# Patient Record
Sex: Male | Born: 1955
Health system: Southern US, Community
[De-identification: ages and names within clinical notes are randomized; demographics above are authoritative.]

## PROBLEM LIST (undated history)

## (undated) DIAGNOSIS — I10 Essential (primary) hypertension: Secondary | ICD-10-CM

---

## 2010-12-01 ENCOUNTER — Emergency Department (HOSPITAL_COMMUNITY)
Admission: EM | Admit: 2010-12-01 | Discharge: 2010-12-02 | Disposition: A | Discharge status: Home / Self Care | Payer: Self-pay | Attending: Emergency Medicine | Admitting: Emergency Medicine

## 2010-12-01 ENCOUNTER — Emergency Department (HOSPITAL_COMMUNITY): Payer: Self-pay

## 2010-12-01 ENCOUNTER — Encounter: Payer: Self-pay | Admitting: Emergency Medicine

## 2010-12-01 DIAGNOSIS — M549 Dorsalgia, unspecified: Secondary | ICD-10-CM

## 2010-12-01 DIAGNOSIS — Z79899 Other long term (current) drug therapy: Secondary | ICD-10-CM | POA: Insufficient documentation

## 2010-12-01 DIAGNOSIS — M545 Low back pain, unspecified: Secondary | ICD-10-CM | POA: Insufficient documentation

## 2010-12-01 DIAGNOSIS — R109 Unspecified abdominal pain: Secondary | ICD-10-CM | POA: Insufficient documentation

## 2010-12-01 DIAGNOSIS — R269 Unspecified abnormalities of gait and mobility: Secondary | ICD-10-CM | POA: Insufficient documentation

## 2010-12-01 DIAGNOSIS — I10 Essential (primary) hypertension: Secondary | ICD-10-CM | POA: Insufficient documentation

## 2010-12-01 HISTORY — DX: Essential (primary) hypertension: I10

## 2010-12-01 LAB — COMPREHENSIVE METABOLIC PANEL
Albumin: 4 g/dL (ref 3.5–5.2)
BUN: 15 mg/dL (ref 6–23)
Calcium: 9.8 mg/dL (ref 8.4–10.5)
GFR calc Af Amer: >90 mL/min (ref >90)
Glucose, Bld: 89 mg/dL (ref 70–99)
Total Protein: 7.5 g/dL (ref 6.0–8.3)

## 2010-12-01 LAB — CBC
HCT: 37.1 % — ABNORMAL LOW (ref 39.0–52.0)
Hemoglobin: 13.2 g/dL (ref 13.0–17.0)
MCV: 91.2 fL (ref 78.0–100.0)
RBC: 4.07 MIL/uL — ABNORMAL LOW (ref 4.22–5.81)
WBC: 5.4 10*3/uL (ref 4.0–10.5)

## 2010-12-01 LAB — DIFFERENTIAL
Basophils Absolute: 0 10*3/uL (ref 0.0–0.1)
Lymphocytes Relative: 41 % (ref 12–46)
Lymphs Abs: 2.2 10*3/uL (ref 0.7–4.0)
Monocytes Absolute: 0.4 10*3/uL (ref 0.1–1.0)
Monocytes Relative: 8 % (ref 3–12)
Neutro Abs: 2.6 10*3/uL (ref 1.7–7.7)

## 2010-12-01 MED ORDER — ONDANSETRON 8 MG PO TBDP
8.0000 mg | ORAL_TABLET | Freq: Once | ORAL | Status: AC
  Administered 2010-12-01: 8 mg via ORAL
  Filled 2010-12-01: qty 1

## 2010-12-01 MED ORDER — HYDROMORPHONE HCL PF 1 MG/ML IJ SOLN
1.0000 mg | Freq: Once | INTRAMUSCULAR | Status: AC
  Administered 2010-12-01: 1 mg via INTRAMUSCULAR
  Filled 2010-12-01: qty 1

## 2010-12-01 NOTE — ED Provider Notes (Signed)
History     CSN: [id number] Arrival date & time: 12/01/2010  8:04 PM   First MD Initiated Contact with Patient 12/01/10 2217      Chief Complaint  Patient presents with  . Back Pain    Onset three weeks ago    (Consider location/radiation/quality/duration/timing/severity/associated sxs/prior treatment) HPI Comments: Patient presents with a chief complaint of left lower back pain. He states the onset was couple weeks ago however it is gotten progressively worse and today it is unbearable. He denies any trauma, injury, lifting, or a history of chronic back pain. He denies numbness and tingling of his extremities and lost control of bowel and bladder. Patient also denies urinary symptoms including frequency, hematuria, urgency, and retention.  Patient is a 55 y.o. male presenting with back pain. The history is provided by the patient.  Back Pain  Pertinent negatives include no chest pain, no fever, no numbness, no headaches, no abdominal pain and no weakness.    Past Medical History  Diagnosis Date  . Hypertension     History reviewed. No pertinent past surgical history.  No family history on file.  History  Substance Use Topics  . Smoking status: Never Smoker   . Smokeless tobacco: Not on file  . Alcohol Use: No      Review of Systems  Constitutional: Positive for activity change. Negative for fever, chills, diaphoresis, fatigue and unexpected weight change.  HENT: Negative for neck pain and neck stiffness.   Eyes: Negative for visual disturbance.  Respiratory: Negative for shortness of breath.   Cardiovascular: Negative for chest pain and leg swelling.  Gastrointestinal: Negative for nausea, abdominal pain, constipation and rectal pain.  Genitourinary: Negative for urgency and difficulty urinating.       Patient denies bowel and bladder incontinence.  Musculoskeletal: Positive for back pain and gait problem. Negative for myalgias, joint swelling and arthralgias.    Neurological: Negative for weakness, numbness and headaches.  All other systems reviewed and are negative.    Allergies  Review of patient's allergies indicates no known allergies.  Home Medications   Current Outpatient Rx  Name Route Sig Dispense Refill  . AMLODIPINE BESYLATE 5 MG PO TABS Oral Take 5 mg by mouth daily.      . INDOMETHACIN 25 MG PO CAPS Oral Take 25 mg by mouth 3 (three) times daily with meals.        BP 151/89  Pulse 82  Temp(Src) 98 F (36.7 C) (Oral)  Resp 16  Wt 170 lb (77.111 kg)  SpO2 99%  Physical Exam  Constitutional: He is oriented to person, place, and time. He appears well-developed and well-nourished. No distress.  HENT:  Head: Normocephalic and atraumatic.  Eyes: Conjunctivae and EOM are normal. Pupils are equal, round, and reactive to light. No scleral icterus.  Neck: Normal range of motion and full passive range of motion without pain. Neck supple. No tracheal tenderness, no spinous process tenderness and no muscular tenderness present. Carotid bruit is not present. No Brudzinski's sign noted. No mass and no thyromegaly present.  Cardiovascular: Normal rate, regular rhythm and intact distal pulses.  Exam reveals no gallop and no friction rub.   No murmur heard. Pulmonary/Chest: Effort normal and breath sounds normal. No stridor. No respiratory distress. He has no wheezes. He has no rales. He exhibits no tenderness.  Abdominal: Soft. Bowel sounds are normal.  Genitourinary:       Severe CVA tenderness on the left side.  Musculoskeletal: Normal range of  motion.       Cervical back: He exhibits normal range of motion, no tenderness, no bony tenderness and no pain.       Thoracic back: Normal. He exhibits no tenderness, no bony tenderness and no pain.       Lumbar back: Normal. He exhibits no tenderness, no bony tenderness, no pain, no spasm and normal pulse.       Right foot: He exhibits no swelling.       Left foot: He exhibits no swelling.        Pt has increased pain w ROM of lumbar spine. Pain w ambulation.   Neurological: He is alert and oriented to person, place, and time. He has normal strength and normal reflexes. No cranial nerve deficit or sensory deficit.  Skin: Skin is warm and dry. No rash noted. He is not diaphoretic. No erythema. No pallor.  Psychiatric: He has a normal mood and affect.    ED Course  Procedures (including critical care time)  Labs Reviewed  COMPREHENSIVE METABOLIC PANEL - Abnormal; Notable for the following:    GFR calc non Af Amer 81 (*)    All other components within normal limits  CBC - Abnormal; Notable for the following:    RBC 4.07 (*)    HCT 37.1 (*)    All other components within normal limits  DIFFERENTIAL     No diagnosis found.   RADIOLOGY REPORT*  Clinical Data: 55 year old male with abdominal pain, back pain.  CT ABDOMEN AND PELVIS WITHOUT CONTRAST  Technique: Multidetector CT imaging of the abdomen and pelvis was performed following the standard protocol without intravenous contrast.  Comparison: None.  Findings: Lung bases are clear. 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 No acute osseous abnormality identified. There is borderline to mild lower lumbar degenerative spinal stenosis related to disc bulges.  No pelvic free fluid. Stool ball in the rectum. Negative distal colon otherwise. Retained stool throughout the more proximal colon. Normal appendix. No dilated small bowel. Negative noncontrast stomach, duodenum, liver, gallbladder, spleen, pancreas, and adrenal glands.  No left nephrolithiasis, hydronephrosis, hydroureter, or perinephric stranding. Multiple pelvic phleboliths. No right hydronephrosis, nephrolithiasis, hydroureter, or perinephric stranding. No ureteral calculus identified. No abdominal free fluid. The bladder is mildly distended and unremarkable.  IMPRESSION: 1. No urologic calculus or obstructive  uropathy. 2. Normal appendix. No acute findings identified in the abdomen or pelvis. 3. 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 Borderline to mild lower lumbar spinal stenosis.  Original Report Authenticated By: Harley Hallmark, M.D.    MDM  Back Pain         [place], [place] 12/02/10 [phone]

## 2010-12-01 NOTE — ED Notes (Signed)
Pt c/o left low back pain, non radiating, denies injury.   States the pain is worse when he sits on the LT gluteus.  It is less painful sitting upright, leaning onto the RT side.  Denies any urinary issues.  Pt is ambulatory.

## 2010-12-01 NOTE — ED Notes (Signed)
Pt alert, nad, c/o low back pain, onset several weeks ago, pt denies trauma or injury, ambulates to triage, steady gait noted, skin bwd, resp even unlabored, denies changes in bowel or bladder

## 2010-12-02 MED ORDER — IBUPROFEN 800 MG PO TABS: 800.0000 mg | ORAL_TABLET | Freq: Three times a day (TID) | ORAL | Status: AC

## 2010-12-02 MED ORDER — DIAZEPAM 5 MG PO TABS: 5.0000 mg | ORAL_TABLET | Freq: Two times a day (BID) | ORAL | Status: DC

## 2010-12-02 MED ORDER — OXYCODONE-ACETAMINOPHEN 5-325 MG PO TABS: 1.0000 | ORAL_TABLET | ORAL | Status: AC | PRN

## 2010-12-02 MED ORDER — IBUPROFEN 800 MG PO TABS: 800.0000 mg | ORAL_TABLET | Freq: Three times a day (TID) | ORAL | Status: DC

## 2010-12-02 MED ORDER — KETOROLAC TROMETHAMINE 60 MG/2ML IM SOLN: 60.0000 mg | Freq: Once | INTRAMUSCULAR | Status: DC

## 2010-12-02 MED ORDER — OXYCODONE-ACETAMINOPHEN 5-325 MG PO TABS: 1.0000 | ORAL_TABLET | ORAL | Status: DC | PRN

## 2010-12-02 MED ORDER — DIAZEPAM 5 MG PO TABS: 5.0000 mg | ORAL_TABLET | Freq: Two times a day (BID) | ORAL | Status: AC

## 2010-12-02 MED ORDER — DIAZEPAM 5 MG PO TABS: 5.0000 mg | ORAL_TABLET | Freq: Once | ORAL | Status: DC

## 2010-12-02 NOTE — ED Provider Notes (Signed)
Medical screening examination/treatment/procedure(s) were performed by non-physician practitioner and as supervising physician I was immediately available for consultation/collaboration.  Juliet Rude. Rubin Payor, MD 12/02/10 2120

## 2013-07-02 ENCOUNTER — Encounter (HOSPITAL_COMMUNITY): Payer: Self-pay | Admitting: Family Medicine

## 2013-07-02 ENCOUNTER — Emergency Department (INDEPENDENT_AMBULATORY_CARE_PROVIDER_SITE_OTHER)
Admission: EM | Admit: 2013-07-02 | Discharge: 2013-07-02 | Disposition: A | Discharge status: Home / Self Care | Payer: PRIVATE HEALTH INSURANCE | Source: Home / Self Care

## 2013-07-02 ENCOUNTER — Emergency Department (INDEPENDENT_AMBULATORY_CARE_PROVIDER_SITE_OTHER): Payer: PRIVATE HEALTH INSURANCE

## 2013-07-02 DIAGNOSIS — M7989 Other specified soft tissue disorders: Secondary | ICD-10-CM

## 2013-07-02 DIAGNOSIS — M79609 Pain in unspecified limb: Secondary | ICD-10-CM

## 2013-07-02 DIAGNOSIS — M79603 Pain in arm, unspecified: Secondary | ICD-10-CM

## 2013-07-02 LAB — COMPREHENSIVE METABOLIC PANEL
ALBUMIN: 4.3 g/dL (ref 3.5–5.2)
ALK PHOS: 63 U/L (ref 39–117)
ALT: 25 U/L (ref 0–53)
AST: 32 U/L (ref 0–37)
BUN: 16 mg/dL (ref 6–23)
CALCIUM: 9.8 mg/dL (ref 8.4–10.5)
CO2: 28 mEq/L (ref 19–32)
Chloride: 100 mEq/L (ref 96–112)
Creatinine, Ser: 1.03 mg/dL (ref 0.50–1.35)
GFR calc Af Amer: >90 mL/min (ref >90)
GFR calc non Af Amer: 80 mL/min — ABNORMAL LOW (ref >90)
Glucose, Bld: 83 mg/dL (ref 70–99)
POTASSIUM: 4.2 meq/L (ref 3.7–5.3)
SODIUM: 141 meq/L (ref 137–147)
TOTAL PROTEIN: 8.2 g/dL (ref 6.0–8.3)
Total Bilirubin: 0.5 mg/dL (ref 0.3–1.2)

## 2013-07-02 MED ORDER — DICLOFENAC SODIUM 1 % TD GEL: 2.0000 g | Freq: Four times a day (QID) | TRANSDERMAL | Status: DC

## 2013-07-02 MED ORDER — ENOXAPARIN SODIUM 80 MG/0.8ML ~~LOC~~ SOLN
1.0000 mg/kg | Freq: Once | SUBCUTANEOUS | Status: AC
Start: 2013-07-02 — End: 2013-07-02
  Administered 2013-07-02: 80 mg via SUBCUTANEOUS
  Filled 2013-07-02: qty 0.8

## 2013-07-02 MED ORDER — TRAMADOL HCL 50 MG PO TABS: 50.0000 mg | ORAL_TABLET | Freq: Four times a day (QID) | ORAL | Status: DC | PRN

## 2013-07-02 NOTE — Discharge Instructions (Signed)
The cause of your arm pain is not clear but is concerning for a clot. Please come back tomorrow morning to the hospital for your doppler study. When that is done come back to the urgent care to go over the results. Please go to the emergency room immediately if you develop chest pain and shortness of breath Please apply ice to your shoulder tonight. Use the tramadol and voltaren gel as needed for pain.  Hopefully your shoulder pain is nothing more than overuse adn will improve with time and rest.

## 2013-07-02 NOTE — ED Notes (Signed)
C/o L shoulder pain onset @ 1900 onset Sunday.  No known injury.  He worked out on Friday. Took 2 Advil on Sunday without relief.  Pain cont. Yesterday.  No SOB, nausea or sweating.  Pain radiates up L side of neck and down to mid upper arm

## 2013-07-02 NOTE — ED Provider Notes (Signed)
CSN: [id number]     Arrival date & time 07/02/13  1841 History   None    Chief Complaint  Patient presents with  . Shoulder Pain   (Consider location/radiation/quality/duration/timing/severity/associated sxs/prior Treatment) HPI  L shoulder pain: Karati workouts last week (which is nml for pt). [place] hurting during the drive. Advil 400mg  w/o much improvement. Heat pad w/o benefit. Worse w/ certain movements - particularly cross body movement, overhead. Sensation and strength in arma dn hand intact. No h/o trauma. Non radiating. No change since Sunday.  Denies recent fevers, wt loss, night sweats.    Past Medical History  Diagnosis Date  . Hypertension    History reviewed. No pertinent past surgical history. Family History  Problem Relation Age of Onset  . Cancer Mother     breast  . Hypertension Father    History  Substance Use Topics  . Smoking status: Never Smoker   . Smokeless tobacco: Not on file  . Alcohol Use: No    Review of Systems Per HPI w/ all other systems negative Allergies  Review of patient's allergies indicates no known allergies.  Home Medications   Prior to Admission medications   Medication Sig Start Date End Date Taking? Authorizing Jayelyn Barno  amLODipine (NORVASC) 5 MG tablet Take 5 mg by mouth daily.      Historical Shanay Woolman, MD  diclofenac sodium (VOLTAREN) 1 % GEL Apply 2 g topically 4 (four) times daily. 07/02/13   Ozella Rocksavid J Merrell, MD  indomethacin (INDOCIN) 25 MG capsule Take 25 mg by mouth 3 (three) times daily with meals.      Historical Taleigha Pinson, MD  traMADol (ULTRAM) 50 MG tablet Take 1 tablet (50 mg total) by mouth every 6 (six) hours as needed. 07/02/13   Ozella Rocksavid J Merrell, MD   BP 158/99  Pulse 80  Temp(Src) 97.8 F (36.6 C) (Oral)  Resp 12  Wt 173 lb 8 oz (78.699 kg)  SpO2 96% Physical Exam  Constitutional: He is oriented to person, place, and time. He appears well-developed and  well-nourished. No distress.  HENT:  Head: Normocephalic and atraumatic.  Eyes: EOM are normal. Pupils are equal, round, and reactive to light.  Neck: Normal range of motion.  Cardiovascular: Normal rate, normal heart sounds and intact distal pulses.  Exam reveals no gallop.   No murmur heard. Pulmonary/Chest: Effort normal and breath sounds normal. No respiratory distress.  Abdominal: Soft. He exhibits no distension.  Musculoskeletal: He exhibits no edema and no tenderness.  L arm limited ROM due to pain. TTP along the soft tissue of the anterior shoulder especially below the [facility]. No bony abnormality. Diffuse L arm and hand enlargement.   Neurological: He is alert and oriented to person, place, and time. He exhibits normal muscle tone.  Skin: Skin is warm and dry. No rash noted. He is not diaphoretic.  Psychiatric: He has a normal mood and affect. His behavior is normal. Judgment and thought content normal.    ED Course  Procedures (including critical care time) Labs Review Labs Reviewed  COMPREHENSIVE METABOLIC PANEL    Imaging Review Dg Shoulder Left  07/02/2013   CLINICAL DATA:  Right shoulder pain and swelling  EXAM: LEFT SHOULDER - 2+ VIEW  COMPARISON:  None.  FINDINGS: Glenohumeral joint is intact. No evidence of scapular fracture or humeral fracture. The acromioclavicular joint is intact.  IMPRESSION: No acute osseous abnormality.   Electronically Signed   By: Genevive BiStewart  Edmunds  M.D.   On: 07/02/2013 19:55     MDM   1. Arm swelling   2. Arm pain    L arm pain and swelling most concerning for UE DVT. No CP or SOB, so PE is of little concern at this point. Pt given tramadol adn voltaren gel tonight for pain. Lovenox 1mg /kg given prior to leaving [facility]. [facility] thing in the morning for Venous duplex of the LUE. [facility]. If negative will treat as overuse injury w/ NSAIDs, rest, ice. If DVT then will start long  term anticoagulation regimen.  Shelly Flattenavid Merrell, MD Family Medicine PGY-3 07/02/2013, 8:07 PM      Ozella Rocksavid J Merrell, MD 07/02/13 2007

## 2013-07-03 ENCOUNTER — Ambulatory Visit (HOSPITAL_COMMUNITY)
Admission: RE | Admit: 2013-07-03 | Discharge: 2013-07-03 | Disposition: A | Discharge status: Home / Self Care | Payer: PRIVATE HEALTH INSURANCE | Source: Ambulatory Visit | Attending: Obstetrics and Gynecology | Admitting: Obstetrics and Gynecology

## 2013-07-03 ENCOUNTER — Other Ambulatory Visit (HOSPITAL_COMMUNITY): Payer: Self-pay | Admitting: Family Medicine

## 2013-07-03 DIAGNOSIS — M79609 Pain in unspecified limb: Secondary | ICD-10-CM

## 2013-07-03 DIAGNOSIS — M25519 Pain in unspecified shoulder: Secondary | ICD-10-CM

## 2013-07-03 DIAGNOSIS — M7989 Other specified soft tissue disorders: Secondary | ICD-10-CM

## 2013-07-03 DIAGNOSIS — M25529 Pain in unspecified elbow: Secondary | ICD-10-CM

## 2013-07-03 MED ORDER — DICLOFENAC SODIUM 75 MG PO TBEC: 75.0000 mg | DELAYED_RELEASE_TABLET | Freq: Two times a day (BID) | ORAL | Status: DC

## 2013-07-03 NOTE — Progress Notes (Signed)
*  Preliminary Results* Left upper extremity venous duplex completed. Left upper extremity is negative for deep and superficial vein thrombosis.  Preliminary results discussed with Clydie BraunKaren of Urgent Care.  07/03/2013 10:21 AM  Gertie FeyMichelle Simonetti, RVT, RDCS, RDMS

## 2013-07-03 NOTE — ED Provider Notes (Signed)
Medical screening examination/treatment/procedure(s) were performed by a resident physician and as supervising physician I was immediately available for consultation/collaboration.  Leslee Homeavid Keller, M.D.   Reuben Likesavid C Keller, MD 07/03/13 [phone]

## 2013-09-20 ENCOUNTER — Encounter (HOSPITAL_COMMUNITY): Payer: Self-pay | Admitting: Emergency Medicine

## 2013-09-20 ENCOUNTER — Emergency Department (HOSPITAL_COMMUNITY)
Admission: EM | Admit: 2013-09-20 | Discharge: 2013-09-20 | Disposition: A | Discharge status: Home / Self Care | Payer: PRIVATE HEALTH INSURANCE | Attending: Emergency Medicine | Admitting: Emergency Medicine

## 2013-09-20 DIAGNOSIS — I10 Essential (primary) hypertension: Secondary | ICD-10-CM | POA: Insufficient documentation

## 2013-09-20 DIAGNOSIS — M25552 Pain in left hip: Secondary | ICD-10-CM

## 2013-09-20 DIAGNOSIS — Z79899 Other long term (current) drug therapy: Secondary | ICD-10-CM | POA: Insufficient documentation

## 2013-09-20 DIAGNOSIS — Z791 Long term (current) use of non-steroidal anti-inflammatories (NSAID): Secondary | ICD-10-CM | POA: Insufficient documentation

## 2013-09-20 DIAGNOSIS — M25559 Pain in unspecified hip: Secondary | ICD-10-CM | POA: Insufficient documentation

## 2013-09-20 MED ORDER — NAPROXEN 500 MG PO TABS: 500.0000 mg | ORAL_TABLET | Freq: Two times a day (BID) | ORAL | Status: DC

## 2013-09-20 MED ORDER — HYDROCODONE-ACETAMINOPHEN 5-325 MG PO TABS: 1.0000 | ORAL_TABLET | Freq: Four times a day (QID) | ORAL | Status: DC | PRN

## 2013-09-20 NOTE — ED Notes (Signed)
Pt c/o left hip pain onset Monday. Pt denies injury, but works out. Pt has cane with him to help with ambulation. Pt does not normally use a cane.

## 2013-09-20 NOTE — ED Provider Notes (Signed)
CSN: [id number]     Arrival date & time 09/20/13  1059 History  This chart was scribed for non-physician practitioner, Santiago Glad, PA-C, working with Mirian Mo, MD by Charline Bills, ED Scribe. This patient was seen in room TR09C/TR09C and the patient's care was started at 11:41 AM.   Chief Complaint  Patient presents with  . Hip Pain   The history is provided by the patient. No language interpreter was used.   HPI Comments: Blake [name] is a 58 y.o. male who presents to the Emergency Department complaining of gradually worsening L hip pain that radiates to L knee onset 4 days ago, worsened over the past 2 days. Pt noted the pain after practicing karate. He denies fall or injury. Pt reports difficulty sleeping due to severity of pain. He denies fever, chills, redness, swelling, numbness/tingling. He also denies h/o similar pain. No h/o sickle cell or HIV. Pt has taken ibuprofen for pain relief with mild relief. Pt ambulates with a cane due to pain.    Past Medical History  Diagnosis Date  . Hypertension    History reviewed. No pertinent past surgical history. Family History  Problem Relation Age of Onset  . Cancer Mother     breast  . Hypertension Father    History  Substance Use Topics  . Smoking status: Never Smoker   . Smokeless tobacco: Not on file  . Alcohol Use: No    Review of Systems  Constitutional: Negative for fever and chills.  Musculoskeletal: Positive for arthralgias. Negative for joint swelling.  Neurological: Negative for numbness.  All other systems reviewed and are negative.  Allergies  Shellfish allergy  Home Medications   Prior to Admission medications   Medication Sig Start Date End Date Taking? Authorizing Provider  amLODipine (NORVASC) 5 MG tablet Take 5 mg by mouth daily.      Historical Provider, MD  diclofenac (VOLTAREN) 75 MG EC tablet Take 1 tablet (75 mg total) by mouth 2 (two) times daily. 07/03/13   Elson Areas, PA-C  diclofenac  sodium (VOLTAREN) 1 % GEL Apply 2 g topically 4 (four) times daily. 07/02/13   Ozella Rocks, MD  indomethacin (INDOCIN) 25 MG capsule Take 25 mg by mouth 3 (three) times daily with meals.      Historical Provider, MD  traMADol (ULTRAM) 50 MG tablet Take 1 tablet (50 mg total) by mouth every 6 (six) hours as needed. 07/02/13   Ozella Rocks, MD   Triage Vitals: BP 142/94  Pulse 76  Temp(Src) 98.8 F (37.1 C) (Oral)  Resp 18  Ht  (1.753 m)  Wt 165 lb (74.844 kg)  BMI 24.36 kg/m2  SpO2 95% Physical Exam  Nursing note and vitals reviewed. Constitutional: He is oriented to person, place, and time. He appears well-developed and well-nourished.  HENT:  Head: Normocephalic and atraumatic.  Eyes: Conjunctivae and EOM are normal.  Neck: Neck supple.  Cardiovascular: Normal rate, regular rhythm and normal heart sounds.   Pulses:      Dorsalis pedis pulses are 2+ on the left side.  Pulmonary/Chest: Effort normal and breath sounds normal.  Musculoskeletal: Normal range of motion.       Left hip: He exhibits tenderness. He exhibits normal range of motion.  L hip:  Tenderness to palpation of L hip No erythema, edema, warmth of the left hip  Full ROM of L hip   Neurological: He is alert and oriented to person, place, and time.  Distal sensation of L foot intact  Skin: Skin is warm and dry.  Psychiatric: He has a normal mood and affect. His behavior is normal.   ED Course  Procedures (including critical care time) DIAGNOSTIC STUDIES: Oxygen Saturation is 95% on RA, normal by my interpretation.    COORDINATION OF CARE: 11:47 AM-Discussed treatment plan which includes medication for pain and rest with pt at bedside and pt agreed to plan.   Labs Review Labs Reviewed - No data to display  Imaging Review No results found.   EKG Interpretation None      MDM   Final diagnoses:  None   Patient presenting with left hip pain that came after doing Karate.  Suspect pain is  musculoskeletal.  No signs of joint infection.  Patient with full ROM of the hip.  Therefore, do not feel that imaging is indicated at this time.   Patient neurovascularly intact.  Stable for discharge.  Return precautions given.    I personally performed the services described in this documentation, which was scribed in my presence. The recorded information has been reviewed and is accurate.    Santiago Glad, PA-C 09/20/13 1319

## 2013-09-21 NOTE — ED Provider Notes (Signed)
Medical screening examination/treatment/procedure(s) were performed by non-physician practitioner and as supervising physician I was immediately available for consultation/collaboration.   EKG Interpretation None        Matthew Gentry, MD 09/21/13 1419 

## 2014-10-22 ENCOUNTER — Ambulatory Visit (INDEPENDENT_AMBULATORY_CARE_PROVIDER_SITE_OTHER): Payer: PRIVATE HEALTH INSURANCE

## 2014-10-22 ENCOUNTER — Ambulatory Visit (INDEPENDENT_AMBULATORY_CARE_PROVIDER_SITE_OTHER): Payer: PRIVATE HEALTH INSURANCE | Admitting: Emergency Medicine

## 2014-10-22 VITALS — BP 128/88 | HR 60 | Temp 97.7°F | Resp 18 | Ht 68.75 in | Wt 168.8 lb

## 2014-10-22 DIAGNOSIS — M79675 Pain in left toe(s): Secondary | ICD-10-CM | POA: Diagnosis not present

## 2014-10-22 DIAGNOSIS — L03116 Cellulitis of left lower limb: Secondary | ICD-10-CM

## 2014-10-22 LAB — POCT CBC
Granulocyte percent: 68.7 %G (ref 37–80)
HCT, POC: 40.2 % — AB (ref 43.5–53.7)
Hemoglobin: 13 g/dL — AB (ref 14.1–18.1)
LYMPH, POC: 1.5 (ref 0.6–3.4)
MCH, POC: 30.4 pg (ref 27–31.2)
MCHC: 32.4 g/dL (ref 31.8–35.4)
MCV: 93.7 fL (ref 80–97)
MID (CBC): 0.5 (ref 0–0.9)
MPV: 6.2 fL (ref 0–99.8)
PLATELET COUNT, POC: 321 10*3/uL (ref 142–424)
POC Granulocyte: 4.3 (ref 2–6.9)
POC LYMPH %: 23.6 % (ref 10–50)
POC MID %: 7.7 % (ref 0–12)
RBC: 4.28 M/uL — AB (ref 4.69–6.13)
RDW, POC: 14.2 %
WBC: 6.2 10*3/uL (ref 4.6–10.2)

## 2014-10-22 LAB — POCT SKIN KOH: SKIN KOH, POC: POSITIVE

## 2014-10-22 LAB — GLUCOSE, POCT (MANUAL RESULT ENTRY): POC Glucose: 95 mg/dl (ref 70–99)

## 2014-10-22 MED ORDER — DOXYCYCLINE HYCLATE 100 MG PO CAPS: 100.0000 mg | ORAL_CAPSULE | Freq: Two times a day (BID) | ORAL | Status: DC

## 2014-10-22 NOTE — Progress Notes (Addendum)
This chart was scribed for Blake Chris, MD by Stann Ore, Medical Scribe. This patient was seen in Room 4 and the patient's care was started 1:33 PM.  Chief Complaint:  Chief Complaint  Patient presents with  . Foot Problem    Left foot is swollen with a sore in between toes. x1 week    HPI: Blake [name] is a 59 y.o. male who reports to [facility] today complaining of left foot swelling and ache for a week now. He noticed a small blister sore. He pricked it and there was some drainage. He denies being diabetic. He denies history of gout. He denies injury to the area. He denies having similar symptoms in the past. He denies history of MRSA and fhx of MRSA.   He did recently purchase new hiking shoes.   Past Medical History  Diagnosis Date  . Hypertension    History reviewed. No pertinent past surgical history. Social History   Social History  . Marital Status: Single    Spouse Name: N/A  . Number of Children: N/A  . Years of Education: N/A   Social History Main Topics  . Smoking status: Never Smoker   . Smokeless tobacco: None  . Alcohol Use: No  . Drug Use: No  . Sexual Activity: Not Asked   Other Topics Concern  . None   Social History Narrative   Family History  Problem Relation Age of Onset  . Cancer Mother     breast  . Hypertension Father    Allergies  Allergen Reactions  . Shellfish Allergy    Prior to Admission medications   Medication Sig Start Date End Date Taking? Authorizing Provider  amLODipine (NORVASC) 5 MG tablet Take 5 mg by mouth daily.      Historical Provider, MD  diclofenac (VOLTAREN) 75 MG EC tablet Take 1 tablet (75 mg total) by mouth 2 (two) times daily. Patient not taking: Reported on 10/22/2014 07/03/13   Elson Areas, PA-C  diclofenac sodium (VOLTAREN) 1 % GEL Apply 2 g topically 4 (four) times daily. Patient not taking: Reported on 10/22/2014 07/02/13   Ozella Rocks, MD  HYDROcodone-acetaminophen (NORCO/VICODIN) 5-325 MG per  tablet Take 1-2 tablets by mouth every 6 (six) hours as needed. Patient not taking: Reported on 10/22/2014 09/20/13   Santiago Glad, PA-C  indomethacin (INDOCIN) 25 MG capsule Take 25 mg by mouth 3 (three) times daily with meals.      Historical Provider, MD  naproxen (NAPROSYN) 500 MG tablet Take 1 tablet (500 mg total) by mouth 2 (two) times daily. Patient not taking: Reported on 10/22/2014 09/20/13   Santiago Glad, PA-C  traMADol (ULTRAM) 50 MG tablet Take 1 tablet (50 mg total) by mouth every 6 (six) hours as needed. Patient not taking: Reported on 10/22/2014 07/02/13   Ozella Rocks, MD     ROS:  Constitutional: negative for chills, fever, night sweats, weight changes, or fatigue  HEENT: negative for vision changes, hearing loss, congestion, rhinorrhea, ST, epistaxis, or sinus pressure Cardiovascular: negative for chest pain or palpitations Respiratory: negative for hemoptysis, wheezing, shortness of breath, or cough Abdominal: negative for abdominal pain, nausea, vomiting, diarrhea, or constipation Dermatological: positive for bump (left foot, between great and second toe), swelling of great toe Neurologic: negative for headache, dizziness, or syncope All other systems reviewed and are otherwise negative with the exception to those above and in the HPI.  PHYSICAL EXAM: Filed Vitals:   10/22/14 1327  BP:  128/88  Pulse: 60  Temp: 97.7 F (36.5 C)  Resp: 18   Body mass index is 25.12 kg/(m^2).   General: Alert, no acute distress HEENT:  Normocephalic, atraumatic, oropharynx patent. [facility] Cardiovascular:  Regular rate and rhythm, no rubs murmurs or gallops.  No Carotid bruits, radial pulse intact. No pedal edema.  Respiratory: Clear to auscultation bilaterally.  No wheezes, rales, or rhonchi.  No cyanosis, no use of accessory musculature Abdominal: No organomegaly, abdomen is soft and non-tender, positive bowel sounds. No masses.; no palpable nodes in left  groin Musculoskeletal: Gait intact. No edema, tenderness Skin: base of the nail in great toe is tender to palpation, tenderness with areas of discharge between first and second toe, redness of dorsal of left foot which is tender Neurologic: Facial musculature symmetric. Psychiatric: Patient acts appropriately throughout our interaction.  Lymphatic: No cervical or submandibular lymphadenopathy Genitourinary/Anorectal: No acute findings  LABS: Results for orders placed or performed in visit on 10/22/14  POCT Skin KOH  Result Value Ref Range   Skin KOH, POC Positive   POCT CBC  Result Value Ref Range   WBC 6.2 4.6 - 10.2 K/uL   Lymph, poc 1.5 0.6 - 3.4   POC LYMPH PERCENT 23.6 10 - 50 %L   MID (cbc) 0.5 0 - 0.9   POC MID % 7.7 0 - 12 %M   POC Granulocyte 4.3 2 - 6.9   Granulocyte percent 68.7 37 - 80 %G   RBC 4.28 (A) 4.69 - 6.13 M/uL   Hemoglobin 13.0 (A) 14.1 - 18.1 g/dL   HCT, POC 40.9 (A) 81.1 - 53.7 %   MCV 93.7 80 - 97 fL   MCH, POC 30.4 27 - 31.2 pg   MCHC 32.4 31.8 - 35.4 g/dL   RDW, POC 91.4 %   Platelet Count, POC 321 142 - 424 K/uL   MPV 6.2 0 - 99.8 fL  POCT glucose (manual entry)  Result Value Ref Range   POC Glucose 95 70 - 99 mg/dl     EKG/XRAY:   Primary read interpreted by Dr. Cleta Alberts at [facility]. There are severe degenerative changes around the first MTP joint. No other acute abnormalities are seen.   ASSESSMENT/PLAN: Patient appears to have tenia pedis. There is also evidence of a cellulitis involving the base of the nail plate and copy of the foot. Will treat with doxycycline and Lotrimin cream.  By signing my name below, I, Stann Ore, attest that this documentation has been prepared under the direction and in the presence of Blake Chris, MD. Electronically Signed: Stann Ore, Scribe. 10/22/2014 , 1:33 PM .    Gross sideeffects, risk and benefits, and alternatives of medications d/w patient. Patient is aware that all medications have potential  sideeffects and we are unable to predict every sideeffect or drug-drug interaction that may occur.  Blake Chris MD 10/22/2014 1:33 PM

## 2014-10-22 NOTE — Patient Instructions (Signed)
Athlete's Foot °Athlete's foot (tinea pedis) is a fungal infection of the skin on the feet. It often occurs on the skin between the toes or underneath the toes. It can also occur on the soles of the feet. Athlete's foot is more likely to occur in hot, humid weather. Not washing your feet or changing your socks often enough can contribute to athlete's foot. The infection can spread from person to person (contagious). °CAUSES °Athlete's foot is caused by a fungus. This fungus thrives in warm, moist places. Most people get athlete's foot by sharing shower stalls, towels, and wet floors with an infected person. People with weakened immune systems, including those with diabetes, may be more likely to get athlete's foot. °SYMPTOMS  °· Itchy areas between the toes or on the soles of the feet. °· White, flaky, or scaly areas between the toes or on the soles of the feet. °· Tiny, intensely itchy blisters between the toes or on the soles of the feet. °· Tiny cuts on the skin. These cuts can develop a bacterial infection. °· Thick or discolored toenails. °DIAGNOSIS  °Your caregiver can usually tell what the problem is by doing a physical exam. Your caregiver may also take a skin sample from the rash area. The skin sample may be examined under a microscope, or it may be tested to see if fungus will grow in the sample. A sample may also be taken from your toenail for testing. °TREATMENT  °Over-the-counter and prescription medicines can be used to kill the fungus. These medicines are available as powders or creams. Your caregiver can suggest medicines for you. Fungal infections respond slowly to treatment. You may need to continue using your medicine for several weeks. °PREVENTION  °· Do not share towels. °· Wear sandals in wet areas, such as shared locker rooms and shared showers. °· Keep your feet dry. Wear shoes that allow air to circulate. Wear cotton or wool socks. °HOME CARE INSTRUCTIONS  °· Take medicines as directed by  your caregiver. Do not use steroid creams on athlete's foot. °· Keep your feet clean and cool. Wash your feet daily and dry them thoroughly, especially between your toes. °· Change your socks every day. Wear cotton or wool socks. In hot climates, you may need to change your socks 2 to 3 times per day. °· Wear sandals or canvas tennis shoes with good air circulation. °· If you have blisters, soak your feet in Burow's solution or Epsom salts for 20 to 30 minutes, 2 times a day to dry out the blisters. Make sure you dry your feet thoroughly afterward. °SEEK MEDICAL CARE IF:  °· You have a fever. °· You have swelling, soreness, warmth, or redness in your foot. °· You are not getting better after 7 days of treatment. °· You are not completely cured after 30 days. °· You have any problems caused by your medicines. °MAKE SURE YOU:  °· Understand these instructions. °· Will watch your condition. °· Will get help right away if you are not doing well or get worse. °  °This information is not intended to replace advice given to you by your health care provider. Make sure you discuss any questions you have with your health care provider. °  °Document Released: 01/01/2000 Document Revised: 03/28/2011 Document Reviewed: 07/07/2014 °Elsevier Interactive Patient Education ©2016 [employer]vier [employer] ° °Cellulitis °Cellulitis is an infection of the skin and the tissue beneath it. The infected area is usually red and tender. Cellulitis occurs most often in   the arms and lower legs.  °CAUSES  °Cellulitis is caused by bacteria that enter the skin through cracks or cuts in the skin. The most common types of bacteria that cause cellulitis are staphylococci and streptococci. °SIGNS AND SYMPTOMS  °· Redness and warmth. °· Swelling. °· Tenderness or pain. °· Fever. °DIAGNOSIS  °Your health care provider can usually determine what is wrong based on a physical exam. Blood tests may also be done. °TREATMENT  °Treatment usually involves taking an  antibiotic medicine. °HOME CARE INSTRUCTIONS  °· Take your antibiotic medicine as directed by your health care provider. Finish the antibiotic even if you start to feel better. °· Keep the infected arm or leg elevated to reduce swelling. °· Apply a warm cloth to the affected area up to 4 times per day to relieve pain. °· Take medicines only as directed by your health care provider. °· Keep all follow-up visits as directed by your health care provider. °SEEK MEDICAL CARE IF:  °· You notice red streaks coming from the infected area. °· Your red area gets larger or turns dark in color. °· Your bone or joint underneath the infected area becomes painful after the skin has healed. °· Your infection returns in the same area or another area. °· You notice a swollen bump in the infected area. °· You develop new symptoms. °· You have a fever. °SEEK IMMEDIATE MEDICAL CARE IF:  °· You feel very sleepy. °· You develop vomiting or diarrhea. °· You have a general ill feeling (malaise) with muscle aches and pains. °  °This information is not intended to replace advice given to you by your health care provider. Make sure you discuss any questions you have with your health care provider. °  °Document Released: 10/13/2004 Document Revised: 09/24/2014 Document Reviewed: 03/21/2011 °Elsevier Interactive Patient Education ©2016 [employer]vier Inc. ° °

## 2014-10-26 LAB — WOUND CULTURE
GRAM STAIN: NONE SEEN
Gram Stain: NONE SEEN

## 2014-10-27 ENCOUNTER — Telehealth: Payer: Self-pay

## 2014-10-27 MED ORDER — CIPROFLOXACIN HCL 500 MG PO TABS: 500.0000 mg | ORAL_TABLET | Freq: Two times a day (BID) | ORAL | Status: DC

## 2014-10-27 NOTE — Telephone Encounter (Signed)
I sent in new Rx and called and discussed w/pt. He agreed and will RTC if doesn't see improvement on the cipro.

## 2014-10-27 NOTE — Telephone Encounter (Signed)
-----   Message from Collene Gobble, MD sent at 10/27/2014  5:07 PM EDT ----- Please have patient stop doxycycline.: Cipro 500 twice a day #14 no refills

## 2017-02-05 ENCOUNTER — Other Ambulatory Visit: Payer: Self-pay

## 2017-02-05 ENCOUNTER — Encounter (HOSPITAL_COMMUNITY): Payer: Self-pay | Admitting: *Deleted

## 2017-02-05 ENCOUNTER — Ambulatory Visit (HOSPITAL_COMMUNITY)
Admission: EM | Admit: 2017-02-05 | Discharge: 2017-02-05 | Disposition: A | Discharge status: Home / Self Care | Payer: PRIVATE HEALTH INSURANCE | Attending: Internal Medicine | Admitting: Internal Medicine

## 2017-02-05 DIAGNOSIS — M545 Low back pain, unspecified: Secondary | ICD-10-CM

## 2017-02-05 DIAGNOSIS — S161XXA Strain of muscle, fascia and tendon at neck level, initial encounter: Secondary | ICD-10-CM

## 2017-02-05 MED ORDER — METHOCARBAMOL 750 MG PO TABS: 750.0000 mg | ORAL_TABLET | Freq: Four times a day (QID) | ORAL | 0 refills | Status: AC
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
## 2017-11-12 ENCOUNTER — Observation Stay (HOSPITAL_COMMUNITY)
Admission: EM | Admit: 2017-11-12 | Discharge: 2017-11-14 | Disposition: A | Discharge status: Home / Self Care | Payer: Self-pay | Attending: Internal Medicine | Admitting: Internal Medicine

## 2017-11-12 ENCOUNTER — Observation Stay (HOSPITAL_BASED_OUTPATIENT_CLINIC_OR_DEPARTMENT_OTHER): Payer: PRIVATE HEALTH INSURANCE

## 2017-11-12 ENCOUNTER — Other Ambulatory Visit: Payer: Self-pay

## 2017-11-12 ENCOUNTER — Encounter (HOSPITAL_COMMUNITY): Payer: Self-pay | Admitting: Emergency Medicine

## 2017-11-12 ENCOUNTER — Emergency Department (HOSPITAL_COMMUNITY): Payer: PRIVATE HEALTH INSURANCE

## 2017-11-12 DIAGNOSIS — I160 Hypertensive urgency: Secondary | ICD-10-CM | POA: Insufficient documentation

## 2017-11-12 DIAGNOSIS — R0789 Other chest pain: Secondary | ICD-10-CM | POA: Diagnosis present

## 2017-11-12 DIAGNOSIS — J189 Pneumonia, unspecified organism: Secondary | ICD-10-CM | POA: Insufficient documentation

## 2017-11-12 DIAGNOSIS — I10 Essential (primary) hypertension: Secondary | ICD-10-CM | POA: Diagnosis present

## 2017-11-12 DIAGNOSIS — I129 Hypertensive chronic kidney disease with stage 1 through stage 4 chronic kidney disease, or unspecified chronic kidney disease: Secondary | ICD-10-CM | POA: Insufficient documentation

## 2017-11-12 DIAGNOSIS — I5030 Unspecified diastolic (congestive) heart failure: Secondary | ICD-10-CM

## 2017-11-12 DIAGNOSIS — N189 Chronic kidney disease, unspecified: Secondary | ICD-10-CM | POA: Insufficient documentation

## 2017-11-12 DIAGNOSIS — R079 Chest pain, unspecified: Secondary | ICD-10-CM | POA: Insufficient documentation

## 2017-11-12 DIAGNOSIS — Z79899 Other long term (current) drug therapy: Secondary | ICD-10-CM | POA: Insufficient documentation

## 2017-11-12 DIAGNOSIS — N289 Disorder of kidney and ureter, unspecified: Secondary | ICD-10-CM

## 2017-11-12 DIAGNOSIS — R0781 Pleurodynia: Principal | ICD-10-CM | POA: Insufficient documentation

## 2017-11-12 LAB — BASIC METABOLIC PANEL
Anion gap: 11 (ref 5–15)
Anion gap: 7 (ref 5–15)
BUN: 15 mg/dL (ref 6–20)
BUN: 17 mg/dL (ref 6–20)
CALCIUM: 9.1 mg/dL (ref 8.9–10.3)
CHLORIDE: 104 mmol/L (ref 98–111)
CHLORIDE: 107 mmol/L (ref 98–111)
CO2: 25 mmol/L (ref 22–32)
CO2: 25 mmol/L (ref 22–32)
CREATININE: 1.31 mg/dL — AB (ref 0.61–1.24)
CREATININE: 1.44 mg/dL — AB (ref 0.61–1.24)
Calcium: 9.8 mg/dL (ref 8.9–10.3)
GFR calc Af Amer: 60 mL/min — ABNORMAL LOW (ref >60)
GFR calc Af Amer: >60 mL/min (ref >60)
GFR calc non Af Amer: 58 mL/min — ABNORMAL LOW (ref >60)
GFR, EST NON AFRICAN AMERICAN: 52 mL/min — AB (ref >60)
GLUCOSE: 86 mg/dL (ref 70–99)
Glucose, Bld: 90 mg/dL (ref 70–99)
POTASSIUM: 4 mmol/L (ref 3.5–5.1)
Potassium: 3.9 mmol/L (ref 3.5–5.1)
Sodium: 140 mmol/L (ref 135–145)
Sodium: 139 mmol/L (ref 135–145)

## 2017-11-12 LAB — TROPONIN I: Troponin I: <0.03 ng/mL (ref <0.03)

## 2017-11-12 LAB — CBC
HCT: 40 % (ref 39.0–52.0)
HEMATOCRIT: 40.1 % (ref 39.0–52.0)
HEMOGLOBIN: 13 g/dL (ref 13.0–17.0)
Hemoglobin: 13.1 g/dL (ref 13.0–17.0)
MCH: 31.3 pg (ref 26.0–34.0)
MCH: 31.3 pg (ref 26.0–34.0)
MCHC: 32.5 g/dL (ref 30.0–36.0)
MCHC: 32.7 g/dL (ref 30.0–36.0)
MCV: 95.9 fL (ref 80.0–100.0)
MCV: 96.4 fL (ref 80.0–100.0)
Platelets: 264 10*3/uL (ref 150–400)
Platelets: 220 10*3/uL (ref 150–400)
RBC: 4.18 MIL/uL — ABNORMAL LOW (ref 4.22–5.81)
RBC: 4.15 MIL/uL — AB (ref 4.22–5.81)
RDW: 13.9 % (ref 11.5–15.5)
RDW: 13.9 % (ref 11.5–15.5)
WBC: 5.3 10*3/uL (ref 4.0–10.5)
WBC: 5.5 10*3/uL (ref 4.0–10.5)
nRBC: 0 % (ref 0.0–0.2)
nRBC: 0 % (ref 0.0–0.2)

## 2017-11-12 LAB — HEPATIC FUNCTION PANEL
ALK PHOS: 52 U/L (ref 38–126)
ALT: 22 U/L (ref 0–44)
AST: 24 U/L (ref 15–41)
Albumin: 3.8 g/dL (ref 3.5–5.0)
BILIRUBIN DIRECT: 0.1 mg/dL (ref 0.0–0.2)
BILIRUBIN TOTAL: 0.6 mg/dL (ref 0.3–1.2)
Indirect Bilirubin: 0.5 mg/dL (ref 0.3–0.9)
Total Protein: 6.7 g/dL (ref 6.5–8.1)

## 2017-11-12 LAB — ECHOCARDIOGRAM COMPLETE
HEIGHTINCHES: 69 in
Weight: 2800 oz

## 2017-11-12 LAB — I-STAT TROPONIN, ED: Troponin i, poc: 0 ng/mL (ref 0.00–0.08)

## 2017-11-12 LAB — BRAIN NATRIURETIC PEPTIDE: B Natriuretic Peptide: 28.5 pg/mL (ref 0.0–100.0)

## 2017-11-12 LAB — HIV ANTIBODY (ROUTINE TESTING W REFLEX): HIV Screen 4th Generation wRfx: NONREACTIVE

## 2017-11-12 MED ORDER — HYDROMORPHONE HCL 1 MG/ML IJ SOLN
1.0000 mg | Freq: Once | INTRAMUSCULAR | Status: AC
  Administered 2017-11-12: 1 mg via INTRAVENOUS
  Filled 2017-11-12: qty 1

## 2017-11-12 MED ORDER — HYDRALAZINE HCL 20 MG/ML IJ SOLN
5.0000 mg | INTRAMUSCULAR | Status: DC | PRN
  Administered 2017-11-12 – 2017-11-13 (×3): 5 mg via INTRAVENOUS
  Filled 2017-11-12 (×3): qty 1

## 2017-11-12 MED ORDER — ASPIRIN EC 81 MG PO TBEC
81.0000 mg | DELAYED_RELEASE_TABLET | Freq: Every day | ORAL | Status: DC
  Administered 2017-11-12 – 2017-11-14 (×3): 81 mg via ORAL
  Filled 2017-11-12 (×3): qty 1

## 2017-11-12 MED ORDER — LISINOPRIL 20 MG PO TABS: 20.0000 mg | ORAL_TABLET | Freq: Every day | ORAL | Status: DC

## 2017-11-12 MED ORDER — ASPIRIN EC 81 MG PO TBEC: 81.0000 mg | DELAYED_RELEASE_TABLET | Freq: Every day | ORAL | Status: DC

## 2017-11-12 MED ORDER — AMLODIPINE BESYLATE 10 MG PO TABS
10.0000 mg | ORAL_TABLET | Freq: Every day | ORAL | Status: DC
  Administered 2017-11-12 – 2017-11-14 (×3): 10 mg via ORAL
  Filled 2017-11-12 (×2): qty 1
  Filled 2017-11-12: qty 2

## 2017-11-12 MED ORDER — KETOROLAC TROMETHAMINE 30 MG/ML IJ SOLN
30.0000 mg | Freq: Once | INTRAMUSCULAR | Status: AC
  Administered 2017-11-12: 30 mg via INTRAVENOUS
  Filled 2017-11-12: qty 1

## 2017-11-12 MED ORDER — ENOXAPARIN SODIUM 40 MG/0.4ML ~~LOC~~ SOLN
40.0000 mg | SUBCUTANEOUS | Status: DC
  Administered 2017-11-12 – 2017-11-13 (×2): 40 mg via SUBCUTANEOUS
  Filled 2017-11-12 (×2): qty 0.4

## 2017-11-12 MED ORDER — INFLUENZA VAC SPLIT QUAD 0.5 ML IM SUSY
0.5000 mL | PREFILLED_SYRINGE | INTRAMUSCULAR | Status: DC
  Filled 2017-11-12: qty 0.5

## 2017-11-12 MED ORDER — OXYCODONE-ACETAMINOPHEN 5-325 MG PO TABS
1.0000 | ORAL_TABLET | Freq: Four times a day (QID) | ORAL | Status: DC | PRN
  Administered 2017-11-13: 1 via ORAL
  Filled 2017-11-12: qty 1

## 2017-11-12 MED ORDER — LISINOPRIL 10 MG PO TABS: 10.0000 mg | ORAL_TABLET | Freq: Once | ORAL | Status: DC

## 2017-11-12 MED ORDER — LISINOPRIL 20 MG PO TABS
20.0000 mg | ORAL_TABLET | Freq: Once | ORAL | Status: AC
  Administered 2017-11-12: 20 mg via ORAL
  Filled 2017-11-12: qty 1

## 2017-11-12 NOTE — ED Notes (Signed)
Breakfast tray ordered 

## 2017-11-12 NOTE — Progress Notes (Signed)
PLAN OF CARE - [facility]  62 y.o. man with uncontrolled HTN who presents with atypical primarily R sided chest pain and chest wall tenderness and HTNsive urgency. Labs notable for ?new AKI. Negative initial troponin. Initial EKG with non specific ST changes. CXR unremarkable. Patient had previously been on amlodipine but currently does not appear to be on any medications. He received lisinopril 10mg  in ED at around 0600am.   Plan: atypical chest pain and HTN -- chest pain likely MSK  - admit to obs for delta troponin and repeat EKG - TTE ordered - ordering amlodipine 10mg  to start this morning - would hold off on ace-inhibitor given question of AKI - repeat BMP today - low threshold to obtain non contrast CT chest if pain worsens  - can trial prn tramadol for pain  Ike Bene, MD Please page on-call provider in [place] for [facility]
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
## 2017-11-12 NOTE — ED Notes (Signed)
Report given to Becky R.N.

## 2017-11-12 NOTE — ED Notes (Addendum)
Pt resting in bed with family at bedside. Call bell in reach, lights dimmed, tv on.
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
## 2017-11-12 NOTE — Progress Notes (Signed)
  Echocardiogram 2D Echocardiogram has been performed.  Blake [name] T Blake [name] 11/12/2017, 10:46 AM

## 2017-11-12 NOTE — ED Notes (Signed)
Meal tray given 

## 2017-11-12 NOTE — ED Provider Notes (Signed)
Emergency Department Provider Note   I have reviewed the triage vital signs and the nursing notes.   HISTORY  Chief Complaint Chest Pain   HPI Blake [name] is a 62 y.o. male with history of hypertension and noncompliant with his medications the presents to the emergency department today secondary to chest pain.  Patient states he has lower retrosternal chest pain that radiates to the right side.  No associated nausea, vomiting, diaphoresis or shortness of breath.  Started yesterday.  Atraumatic.  No fevers.  States he has not yet taken his medication because he does not have it. No other associated or modifying symptoms.    Past Medical History:  Diagnosis Date  . Hypertension     Patient Active Problem List   Diagnosis Date Noted  . Hypertension 11/12/2017  . Hypertensive urgency 11/12/2017  . Chest pain 11/12/2017  . Renal insufficiency, mild 11/12/2017    History reviewed. No pertinent surgical history.  Current Outpatient Rx  . Order #: [id number] Class: Historical Med  . Order #: [id number] Class: Historical Med  . Order #: [id number] Class: Historical Med  . Order #: [id number] Class: Normal  . Order #: [id number] Class: Normal    Allergies Shellfish allergy  Family History  Problem Relation Age of Onset  . Cancer Mother        breast  . Hypertension Father     Social History Social History   Tobacco Use  . Smoking status: Never Smoker  . Smokeless tobacco: Never Used  Substance Use Topics  . Alcohol use: No  . Drug use: No    Review of Systems  All other systems negative except as documented in the HPI. All pertinent positives and negatives as reviewed in the HPI. ____________________________________________   PHYSICAL EXAM:  VITAL SIGNS: ED Triage Vitals  Enc Vitals Group     BP 11/12/17 0032 (!) 191/118     Pulse Rate 11/12/17 0032 75     Resp 11/12/17 0032 14     Temp 11/12/17 0032 98 F (36.7 C)     Temp Source 11/12/17 0032 Oral   SpO2 11/12/17 0032 97 %     Weight 11/12/17 0027 175 lb (79.4 kg)     Height 11/12/17 0027 5\' 9"  (1.753 m)    Constitutional: Alert and oriented. Well appearing and in no acute distress. Eyes: Conjunctivae are normal. PERRL. EOMI. Head: Atraumatic. Nose: No congestion/rhinnorhea. Mouth/Throat: Mucous membranes are moist.  Oropharynx non-erythematous. Neck: No stridor.  No meningeal signs.   Cardiovascular: Normal rate, regular rhythm. Good peripheral circulation. Grossly normal heart sounds.   Respiratory: Normal respiratory effort.  No retractions. Lungs CTAB. Gastrointestinal: Soft and nontender. No distention.  Musculoskeletal: No lower extremity tenderness nor edema. No gross deformities of extremities. ttp to right lower ribs Neurologic:  Normal speech and language. No gross focal neurologic deficits are appreciated.  Skin:  Skin is warm, dry and intact. No rash noted.  ____________________________________________   LABS (all labs ordered are listed, but only abnormal results are displayed)  Labs Reviewed  BASIC METABOLIC PANEL - Abnormal; Notable for the following components:      Result Value   Creatinine, Ser 1.44 (*)    GFR calc non Af Amer 52 (*)    GFR calc Af Amer 60 (*)    All other components within normal limits  CBC - Abnormal; Notable for the following components:   RBC 4.18 (*)    All other components within normal limits  BRAIN NATRIURETIC PEPTIDE  HEPATIC FUNCTION PANEL  I-STAT TROPONIN, ED   ____________________________________________  EKG   EKG Interpretation  Date/Time:  Sunday November 12 2017 00:56:39 EDT Ventricular Rate:  78 PR Interval:  156 QRS Duration: 100 QT Interval:  372 QTC Calculation: 424 R Axis:   -33 Text Interpretation:  Normal sinus rhythm Possible Left atrial enlargement Left axis deviation Possible Anterior infarct , age undetermined Abnormal ECG no change from earlier in day Confirmed by Marily Memos [phone]) on 11/12/2017  4:49:14 AM      ____________________________________________  RADIOLOGY  Dg Chest 2 View  Result Date: 11/12/2017 CLINICAL DATA:  Chest pain.  Hypertension. EXAM: CHEST - 2 VIEW COMPARISON:  None. FINDINGS: Lungs are clear. The heart size and pulmonary vascularity are normal. No adenopathy. No pneumothorax. No bone lesions. IMPRESSION: No edema or consolidation. Electronically Signed   By: Bretta Bang III M.D.   On: 11/12/2017 01:37    ____________________________________________   PROCEDURES  Procedure(s) performed:   Procedures   ____________________________________________   INITIAL IMPRESSION / ASSESSMENT AND PLAN / ED COURSE  Patient has EKG changes without a previous one to compare.  He also has worsening kidney function and with elevated blood pressures concerning for possible hypertensive crisis.  This is related to his chest pain.  His pain is centrally located but it radiates to the right and is significantly worse with taking a deep breath and with pushing on his right lower ribs.  Will add on LFTs to make sure is not gallbladder liver related but it seems very musculoskeletal in nature.  However secondary to his elevated blood pressure, EKG changes in the renal function will talk to hospitalist will observation for blood pressure control  Pertinent labs & imaging results that were available during my care of the patient were reviewed by me and considered in my medical decision making (see chart for details).  ____________________________________________  FINAL CLINICAL IMPRESSION(S) / ED DIAGNOSES  Final diagnoses:  None     MEDICATIONS GIVEN DURING THIS VISIT:  Medications  HYDROmorphone (DILAUDID) injection 1 mg (1 mg Intravenous Given 11/12/17 0608)  lisinopril (PRINIVIL,ZESTRIL) tablet 20 mg (20 mg Oral Given 11/12/17 0607)     NEW OUTPATIENT MEDICATIONS STARTED DURING THIS VISIT:  New Prescriptions   No medications on file    Note:  This  note was prepared with assistance of Dragon voice recognition software. Occasional wrong-word or sound-a-like substitutions may have occurred due to the inherent limitations of voice recognition software.   Jaelene Garciagarcia, Barbara Cower, MD 11/12/17 [phone]

## 2017-11-12 NOTE — ED Notes (Signed)
After pt ambulated to RR, recheck BP @ 177/110 (128)

## 2017-11-12 NOTE — ED Notes (Signed)
Attempted to call report x 1  

## 2017-11-12 NOTE — Plan of Care (Signed)
  Problem: Education: Goal: Knowledge of General Education information will improve Description Including pain rating scale, medication(s)/side effects and non-pharmacologic comfort measures Outcome: Progressing   

## 2017-11-13 ENCOUNTER — Observation Stay (HOSPITAL_COMMUNITY): Payer: PRIVATE HEALTH INSURANCE

## 2017-11-13 DIAGNOSIS — J181 Lobar pneumonia, unspecified organism: Secondary | ICD-10-CM

## 2017-11-13 DIAGNOSIS — R0781 Pleurodynia: Secondary | ICD-10-CM

## 2017-11-13 DIAGNOSIS — J189 Pneumonia, unspecified organism: Secondary | ICD-10-CM

## 2017-11-13 DIAGNOSIS — I10 Essential (primary) hypertension: Secondary | ICD-10-CM

## 2017-11-13 DIAGNOSIS — I160 Hypertensive urgency: Secondary | ICD-10-CM

## 2017-11-13 LAB — BASIC METABOLIC PANEL
ANION GAP: 6 (ref 5–15)
BUN: 20 mg/dL (ref 6–20)
CALCIUM: 9.2 mg/dL (ref 8.9–10.3)
CO2: 25 mmol/L (ref 22–32)
Chloride: 105 mmol/L (ref 98–111)
Creatinine, Ser: 1.19 mg/dL (ref 0.61–1.24)
GFR calc Af Amer: >60 mL/min (ref >60)
GFR calc non Af Amer: >60 mL/min (ref >60)
Glucose, Bld: 93 mg/dL (ref 70–99)
Potassium: 3.7 mmol/L (ref 3.5–5.1)
SODIUM: 136 mmol/L (ref 135–145)

## 2017-11-13 LAB — C-REACTIVE PROTEIN: CRP: 2.3 mg/dL — ABNORMAL HIGH (ref <1.0)

## 2017-11-13 LAB — SEDIMENTATION RATE: SED RATE: 20 mm/h — AB (ref 0–16)

## 2017-11-13 LAB — CBC
HCT: 39.7 % (ref 39.0–52.0)
Hemoglobin: 13.1 g/dL (ref 13.0–17.0)
MCH: 31 pg (ref 26.0–34.0)
MCHC: 33 g/dL (ref 30.0–36.0)
MCV: 94.1 fL (ref 80.0–100.0)
PLATELETS: 263 10*3/uL (ref 150–400)
RBC: 4.22 MIL/uL (ref 4.22–5.81)
RDW: 13.7 % (ref 11.5–15.5)
WBC: 4.5 10*3/uL (ref 4.0–10.5)
nRBC: 0 % (ref 0.0–0.2)

## 2017-11-13 LAB — D-DIMER, QUANTITATIVE (NOT AT ARMC): D DIMER QUANT: 0.71 ug{FEU}/mL — AB (ref 0.00–0.50)

## 2017-11-13 LAB — LIPID PANEL
CHOL/HDL RATIO: 3.3 ratio
CHOLESTEROL: 212 mg/dL — AB (ref 0–200)
HDL: 64 mg/dL (ref >40)
LDL Cholesterol: 135 mg/dL — ABNORMAL HIGH (ref 0–99)
Triglycerides: 65 mg/dL (ref <150)
VLDL: 13 mg/dL (ref 0–40)

## 2017-11-13 LAB — TSH: TSH: 0.847 u[IU]/mL (ref 0.350–4.500)

## 2017-11-13 LAB — TROPONIN I

## 2017-11-13 LAB — GLUCOSE, CAPILLARY: GLUCOSE-CAPILLARY: 109 mg/dL — AB (ref 70–99)

## 2017-11-13 LAB — MAGNESIUM: MAGNESIUM: 1.8 mg/dL (ref 1.7–2.4)

## 2017-11-13 MED ORDER — DOXYCYCLINE HYCLATE 100 MG PO TABS
100.0000 mg | ORAL_TABLET | Freq: Two times a day (BID) | ORAL | Status: DC
  Administered 2017-11-13 – 2017-11-14 (×3): 100 mg via ORAL
  Filled 2017-11-13 (×3): qty 1

## 2017-11-13 MED ORDER — IOPAMIDOL (ISOVUE-370) INJECTION 76%: 100.0000 mL | Freq: Once | INTRAVENOUS | Status: DC

## 2017-11-13 MED ORDER — IOPAMIDOL (ISOVUE-370) INJECTION 76%
INTRAVENOUS | Status: AC
  Filled 2017-11-13: qty 100

## 2017-11-13 MED ORDER — SODIUM CHLORIDE 0.9 % IV BOLUS
500.0000 mL | Freq: Once | INTRAVENOUS | Status: AC
  Administered 2017-11-13: 500 mL via INTRAVENOUS

## 2017-11-13 MED ORDER — LISINOPRIL 40 MG PO TABS
40.0000 mg | ORAL_TABLET | Freq: Every day | ORAL | Status: DC
  Administered 2017-11-13 – 2017-11-14 (×2): 40 mg via ORAL
  Filled 2017-11-13 (×2): qty 1

## 2017-11-13 MED ORDER — IOPAMIDOL (ISOVUE-370) INJECTION 76%
100.0000 mL | Freq: Once | INTRAVENOUS | Status: AC | PRN
  Administered 2017-11-13: 100 mL via INTRAVENOUS

## 2017-11-13 MED ORDER — DICLOFENAC SODIUM 1 % TD GEL
2.0000 g | Freq: Four times a day (QID) | TRANSDERMAL | Status: DC
  Administered 2017-11-13 – 2017-11-14 (×4): 2 g via TOPICAL
  Filled 2017-11-13: qty 100

## 2017-11-13 NOTE — Progress Notes (Signed)
Progress Note    ALEJANDRA BARNA  WUJ:[id number] DOB: 05/28/1955  DOA: 11/12/2017 PCP: System, Provider Not In    Brief Narrative:     Medical records reviewed and are as summarized below:  Donia Pounds is an 62 y.o. male with medical history significant of hypertension who is coming to the emergency department from home due to right-sided chest pain associated with dyspnea that started while he was driving home around midnight.  The pain is described as sharp, from the right lower sternum and to right lower ribs area.  It is described as pleuritic.  He mentions that he came home and took a muscle relaxant, but did not have any significant relief.  Assessment/Plan:   Principal Problem:   Pleuritic chest pain Active Problems:   Hypertension   Hypertensive urgency   Renal insufficiency, mild   Atypical chest pain   CAP (community acquired pneumonia)  CAP -seen on CT and corresponds to pain -PO doxy  Right sided chest pain -? PNA vs musculoskeletal -abx and voltaren gel  HTN -resume medications  Atypical CP -CE negative -seen by cardiology -doubt cardiac source  AKI -CR back to normal -encourage hydration   Family Communication/Anticipated D/C date and plan/Code Status   DVT prophylaxis: Lovenox ordered. Code Status: Full Code.  Family Communication: at bedsde Disposition Plan: home in the AM   Medical Consultants:    cards  Subjective:   Had episode of dizziness x2 earlier today with sweats  Objective:    Vitals:   11/13/17 1024 11/13/17 1300 11/13/17 1302 11/13/17 1640  BP: 136/89 (!) 153/93 (!) 161/96 (!) 145/90  Pulse:    90  Resp:    18  Temp:    98.4 F (36.9 C)  TempSrc:    Oral  SpO2:    95%  Weight:      Height:        Intake/Output Summary (Last 24 hours) at 11/13/2017 1730 Last data filed at 11/13/2017 1508 Gross per 24 hour  Intake 370 ml  Output -  Net 370 ml   Filed Weights   11/12/17 0027 11/12/17 1658 11/13/17  0505  Weight: 79.4 kg 83 kg 82.6 kg    Exam: In bed, anxious appearing Right base crackle rrr No LE edema  Data Reviewed:   I have personally reviewed following labs and imaging studies:  Labs: Labs show the following:   Basic Metabolic Panel: Recent Labs  Lab 11/12/17 0042 11/12/17 0738 11/13/17 0503  NA 140 139 136  K 4.0 3.9 3.7  CL 104 107 105  CO2 25 25 25   GLUCOSE 86 90 93  BUN 15 17 20   CREATININE 1.44* 1.31* 1.19  CALCIUM 9.8 9.1 9.2  MG  --   --  1.8   GFR Estimated Creatinine Clearance: 66.8 mL/min (by C-G formula based on SCr of 1.19 mg/dL). Liver Function Tests: Recent Labs  Lab 11/12/17 0738  AST 24  ALT 22  ALKPHOS 52  BILITOT 0.6  PROT 6.7  ALBUMIN 3.8   No results for input(s): LIPASE, AMYLASE in the last 168 hours. No results for input(s): AMMONIA in the last 168 hours. Coagulation profile No results for input(s): INR, PROTIME in the last 168 hours.  CBC: Recent Labs  Lab 11/12/17 0042 11/12/17 0738 11/13/17 0503  WBC 5.3 5.5 4.5  HGB 13.1 13.0 13.1  HCT 40.1 40.0 39.7  MCV 95.9 96.4 94.1  PLT 264 220 263   Cardiac Enzymes:  Recent Labs  Lab 11/12/17 0738 11/13/17 1118  TROPONINI <0.03 <0.03   BNP (last 3 results) No results for input(s): PROBNP in the last 8760 hours. CBG: Recent Labs  Lab 11/13/17 1035  GLUCAP 109*   D-Dimer: Recent Labs    11/13/17 1122  DDIMER 0.71*   Hgb A1c: No results for input(s): HGBA1C in the last 72 hours. Lipid Profile: Recent Labs    11/13/17 1118  CHOL 212*  HDL 64  LDLCALC 135*  TRIG 65  CHOLHDL 3.3   Thyroid function studies: Recent Labs    11/12/17 0046  TSH 0.847   Anemia work up: No results for input(s): VITAMINB12, FOLATE, FERRITIN, TIBC, IRON, RETICCTPCT in the last 72 hours. Sepsis Labs: Recent Labs  Lab 11/12/17 0042 11/12/17 0738 11/13/17 0503  WBC 5.3 5.5 4.5    Microbiology No results found for this or any previous visit (from the past 240  hour(s)).  Procedures and diagnostic studies:  Dg Chest 2 View  Result Date: 11/12/2017 CLINICAL DATA:  Chest pain.  Hypertension. EXAM: CHEST - 2 VIEW COMPARISON:  None. FINDINGS: Lungs are clear. The heart size and pulmonary vascularity are normal. No adenopathy. No pneumothorax. No bone lesions. IMPRESSION: No edema or consolidation. Electronically Signed   By: Bretta Bang III M.D.   On: 11/12/2017 01:37   Ct Angio Chest Pe W Or Wo Contrast  Result Date: 11/13/2017 CLINICAL DATA:  Chest pain with positive D-dimer study EXAM: CT ANGIOGRAPHY CHEST WITH CONTRAST TECHNIQUE: Multidetector CT imaging of the chest was performed using the standard protocol during bolus administration of intravenous contrast. Multiplanar CT image reconstructions and MIPs were obtained to evaluate the vascular anatomy. CONTRAST:  65 mL ISOVUE-370 IOPAMIDOL (ISOVUE-370) INJECTION 76% COMPARISON:  Chest radiograph November 12, 2017 FINDINGS: Cardiovascular: There is no demonstrable pulmonary embolus. There is no thoracic aortic aneurysm or dissection. The visualized great vessels appear unremarkable. There are foci of aortic atherosclerosis. There is no pericardial effusion or pericardial thickening evident. Mediastinum/Nodes: Visualized thyroid appears unremarkable. There are scattered subcentimeter mediastinal lymph nodes. There is no adenopathy by size criteria in the thoracic region. No esophageal lesions are evident. Lungs/Pleura: There is atelectatic change in the lung bases posteriorly. There is mild consolidation in these areas, slightly more on the right than on the left. There is a minimal right pleural effusion. Lungs elsewhere are clear. Upper Abdomen: Visualized upper abdominal structures appear normal. Musculoskeletal: There are no blastic or lytic bone lesions. There are no evident chest wall lesions. Review of the MIP images confirms the above findings. IMPRESSION: 1. No demonstrable pulmonary embolus. No  thoracic aortic aneurysm. There is aortic atherosclerosis. 2. Bibasilar atelectasis. Focal consolidation in the posterior lung bases is likely due to atelectasis, although there may be a degree of superimposed pneumonia, particularly in the right base. The lungs elsewhere are clear. Minimal right pleural effusion. 3. Scattered subcentimeter mediastinal lymph nodes without frank adenopathy by size criteria. Aortic Atherosclerosis (ICD10-I70.0). Electronically Signed   By: Bretta Bang III M.D.   On: 11/13/2017 14:09    Medications:   . amLODipine  10 mg Oral Daily  . aspirin EC  81 mg Oral Daily  . diclofenac sodium  2 g Topical QID  . doxycycline  100 mg Oral Q12H  . enoxaparin (LOVENOX) injection  40 mg Subcutaneous Q24H  . Influenza vac split quadrivalent PF  0.5 mL Intramuscular Tomorrow-1000  . iopamidol  100 mL Intravenous Once  . iopamidol      .  lisinopril  40 mg Oral Daily   Continuous Infusions:   LOS: 0 days   Joseph Art  [facility]   *Please refer to [email or web address], password [email or web address] to get updated schedule on who will round on this patient, as hospitalists switch teams weekly. If 7PM-7AM, please contact night-coverage at [email or web address], password [email or web address] for any overnight needs.  11/13/2017, 5:30 PM

## 2017-11-13 NOTE — Progress Notes (Signed)
Patient notified nurse feeling dizzy and nauseated, also noted to be sweating. Sitting up in chair.  Denies pain/SOB.  BP 136/89, HR 84.  Returned to bed. States this happened earlier today when he got up to use urinal and lasted 15 min. States he is already feeling better.  Dr. Benjamine Mola her and notified.  Order for EKG, BG and orthostatic VS.

## 2017-11-13 NOTE — Consult Note (Addendum)
Cardiology Consultation:   Patient ID: Blake [name]; [id number]; 1955/09/15   Admit date: 11/12/2017 Date of Consult: 11/13/2017  Primary Care Provider: System, Provider Not In Primary Cardiologist: No primary care provider on file. - new to Blake [name] Chief Complaint: right sided chest pain  Patient Profile:   Blake [name] is a 62 y.o. male with a hx of HTN (out of medication for several months) who is being seen today for the evaluation of atypical chest pain at the request of Blake [name].  History of Present Illness:   Blake [name] is very active, working out regularly, with no prior cardiac history. He was previously on medication for HTN but ran out of refills a few months ago and never got it filled. He exercised on Friday 10/25 as usual including doing sit-ups. That night while driving between [place] (which he does regularly), he noticed severe right sided lower rib/chest pain acutely worse with inspiration. He took Tylenol while driving but got no relief. He took a muscle relaxer when he got home but the pain persisted. He had a very restless night and could barely sleep. He states he has a very high pain tolerance so hoped it would get better on its own. He laid low throughout the day Saturday but pain persisted. He could not sleep so came to the ER shortly after midnight last night (Sunday). At that point the pain was an 8/10. He was found to be hypertensive up to 191/118. By the morning after being seen, he received dilaudid and toradol. He knows one of these helped to bring the pain down to a 6. He has also been resumed on antihypertensives and the pain is now a 2/10, but acutely worse to an 8 any time he takes a deep breath. He denies any other recent long travel, surgery, bed rest, personal/family history of blood clot or injury. CXR no edema or consolidation, normal mediastinal contours. 2D echo showed EF 60-65%, grade 1 DD, possible small PFO, normal PASP  and RV. Initial labs showed AKI with CR 1.44, improved to 1.19, Troponins neg x 2 and BNP wnl. Hgb is normal and no leukocytosis. I checked BP in the room and both arms were relatively equal in the 150/90 range. Initial EKG showed NSR with inferior TWI; today's also shows TWI in V5-V6. No prior to compare to. He has not had any exertional-type angina. He had SOB yesterday but does not feel it now. There is also some point tenderness of the area.  Of note, when he got up to the bathroom twice he became dizzy, diaphoretic and felt like he was going to vomit. BP was 136/89, HR 84, tele unremarkable. Orthostatics were done which did show variation in BP from 156/99 sitting to 137/95 standing but did not become hypotensive. He has never had pain like this before. He is a nonsmoker with no fam hx of CAD or aneuryms either.    Past Medical History:  Diagnosis Date  . Hypertension     History reviewed. No pertinent surgical history.   Inpatient Medications: Scheduled Meds: . amLODipine  10 mg Oral Daily  . aspirin EC  81 mg Oral Daily  . diclofenac sodium  2 g Topical QID  . enoxaparin (LOVENOX) injection  40 mg Subcutaneous Q24H  . Influenza vac split quadrivalent PF  0.5 mL Intramuscular Tomorrow-1000  . lisinopril  40 mg Oral Daily   Continuous Infusions: . sodium chloride     PRN  Meds: hydrALAZINE, oxyCODONE-acetaminophen  Home Meds: Prior to Admission medications   Medication Sig Start Date End Date Taking? Authorizing Provider  aspirin EC 81 MG tablet Take 81 mg by mouth daily as needed ("for heart health").   Yes Blake [name]  ibuprofen (ADVIL,MOTRIN) 200 MG tablet Take 200-400 mg by mouth every 6 (six) hours as needed (for pain or fever).   Yes Blake [name]  multivitamin (ONE-A-DAY MEN'S) TABS tablet Take 1 tablet by mouth daily.   Yes Blake [name]  ciprofloxacin (CIPRO) 500 MG tablet Take 1 tablet (500 mg total) by mouth 2 (two) times  daily. Patient not taking: Reported on 11/12/2017 10/27/14   Blake [name]  doxycycline (VIBRAMYCIN) 100 MG capsule Take 1 capsule (100 mg total) by mouth 2 (two) times daily. Patient not taking: Reported on 11/12/2017 10/22/14   Blake [name]    Allergies:    Allergies  Allergen Reactions  . Shellfish Allergy Hives and Nausea And Vomiting    Social History:   Social History   Socioeconomic History  . Marital status: Single    Spouse name: Not on file  . Number of children: Not on file  . Years of education: Not on file  . Highest education level: Not on file  Occupational History  . Not on file  Social Needs  . Financial resource strain: Not on file  . Food insecurity:    Worry: Not on file    Inability: Not on file  . Transportation needs:    Medical: Not on file    Non-medical: Not on file  Tobacco Use  . Smoking status: Never Smoker  . Smokeless tobacco: Never Used  Substance and Sexual Activity  . Alcohol use: No  . Drug use: No  . Sexual activity: Not on file  Lifestyle  . Physical activity:    Days per week: Not on file    Minutes per session: Not on file  . Stress: Not on file  Relationships  . Social connections:    Talks on phone: Not on file    Gets together: Not on file    Attends religious service: Not on file    Active member of club or organization: Not on file    Attends meetings of clubs or organizations: Not on file    Relationship status: Not on file  . Intimate partner violence:    Fear of current or ex partner: Not on file    Emotionally abused: Not on file    Physically abused: Not on file    Forced sexual activity: Not on file  Other Topics Concern  . Not on file  Social History Narrative  . Not on file    Family History:   The patient's family history includes Cancer in his mother; Hypertension in his father.  ROS:  Please [name] the history of present illness.  All other ROS reviewed and negative.     Physical  Exam/Data:   Vitals:   11/13/17 0505 11/13/17 0531 11/13/17 0825 11/13/17 1024  BP: (!) 170/94 (!) 185/107 (!) 147/90 136/89  Pulse: 77     Resp: 18     Temp: 98.4 F (36.9 C)     TempSrc: Oral     SpO2: 96%     Weight: 82.6 kg     Height:        Intake/Output Summary (Last 24 hours) at 11/13/2017 1127 Last data filed at 11/13/2017 0500 Gross per 24  hour  Intake 130 ml  Output -  Net 130 ml   Filed Weights   11/12/17 0027 11/12/17 1658 11/13/17 0505  Weight: 79.4 kg 83 kg 82.6 kg   Body mass index is 26.91 kg/m.  General: Well developed, well nourished, in no acute distress. Head: Normocephalic, atraumatic, sclera non-icteric, no xanthomas, nares are without discharge.  Neck: Negative for carotid bruits. JVD not elevated. Lungs: Clear bilaterally to auscultation without wheezes, rales, or rhonchi. Breathing is unlabored. Heart: RRR with S1 S2. No murmurs, rubs, or gallops appreciated. Abdomen: Soft, non-tender, non-distended with normoactive bowel sounds. No hepatomegaly. No rebound/guarding. No obvious abdominal masses. Msk:  Strength and tone appear normal for age. Extremities: No clubbing or cyanosis. No edema.  Distal pedal pulses are 2+ and equal bilaterally. Diminished pedal pulses for DP but PT in tact and equal Neuro: Alert and oriented X 3. No facial asymmetry. No focal deficit. Moves all extremities spontaneously. Psych:  Responds to questions appropriately with a normal affect.  EKG:  The EKG was personally reviewed and demonstrates 11/12/17 EKG shows NSR possible LAE with inferior TWI and subtle ST sagging. Repeat 11/13/17 EKG shows NSR with inferior TWI, also TWI V5-V6  Laboratory Data:  Chemistry Recent Labs  Lab 11/12/17 0042 11/12/17 0738 11/13/17 0503  NA 140 139 136  K 4.0 3.9 3.7  CL 104 107 105  CO2 _0 GLUCOSE 86 90 93  BUN _1 CREATININE 1.44* 1.31* 1.19  CALCIUM 9.8 9.1 9.2  GFRNONAA 52* 58* >60  GFRAA 60* >60 >60   ANIONGAP _2 Recent Labs  Lab 11/12/17 0738  PROT 6.7  ALBUMIN 3.8  AST 24  ALT 22  ALKPHOS 52  BILITOT 0.6   Hematology Recent Labs  Lab 11/12/17 0042 11/12/17 0738 11/13/17 0503  WBC 5.3 5.5 4.5  RBC 4.18* 4.15* 4.22  HGB 13.1 13.0 13.1  HCT 40.1 40.0 39.7  MCV 95.9 96.4 94.1  MCH 31.3 31.3 31.0  MCHC 32.7 32.5 33.0  RDW 13.9 13.9 13.7  PLT 264 220 263   Cardiac Enzymes Recent Labs  Lab 11/12/17 0738  TROPONINI <0.03    Recent Labs  Lab 11/12/17 0050  TROPIPOC 0.00    BNP Recent Labs  Lab 11/12/17 0453  BNP 28.5    DDimer No results for input(s): DDIMER in the last 168 hours.  Radiology/Studies:  Dg Chest 2 View  Result Date: 11/12/2017 CLINICAL DATA:  Chest pain.  Hypertension. EXAM: CHEST - 2 VIEW COMPARISON:  None. FINDINGS: Lungs are clear. The heart size and pulmonary vascularity are normal. No adenopathy. No pneumothorax. No bone lesions. IMPRESSION: No edema or consolidation. Electronically Signed   By: Lowella Grip III M.D.   On: 11/12/2017 01:37    Assessment and Plan:   1. Right sided pleuritic chest pain and orthostasis type symptoms - symptoms are not consistent with ACS and troponin was negative despite persistent pain on admission. Blake [name] has ordered a repeat troponin and will add a d-dimer as well. His BP is equal in both arms with equal pulses bilaterally. CXR is unrevealing and echo does not show any acute abnormalities. Unusual presentation with abnormal EKG as well. EKG is not consistent with pericarditis and there is no effusion on echo. Plan to review with Blake [name].  2. Uncontrolled HTN - has been started on lisinopril and amlodipine.  3. Acute kidney injury - improved.  4. Possible small PFO -  doubt acute contribution to symptoms, can follow as OP. No h/o stroke or TIA.   For questions or updates, please contact [facility] Please consult [email or web address] for contact info under Cardiology/STEMI.     Signed, Charlie Pitter, PA-C  11/13/2017 11:27 AM  ---------------------------------------------------------------------------------------------   History and all data above reviewed.  Patient examined.  I agree with the findings as above.  Blake [name] presents with right sided pleuritic chest pain, that is also tender to palpation. He has ruled out for ACS, and his echocardiogram was unremarkable for pericardial effusion or constrictive features in acute pericarditis.   Constitutional: No acute distress Eyes: pupils equally round and reactive to light, sclera non-icteric, normal conjunctiva and lids ENMT: normal dentition, moist mucous membranes Cardiovascular: regular rhythm, normal rate, no murmurs. S1 and S2 normal. Radial pulses normal bilaterally. No jugular venous distention.  Chest: tender over right axillary line, no deformity Respiratory: clear to auscultation bilaterally GI : normal bowel sounds, soft and nontender. No distention.   MSK: extremities warm, well perfused. No edema.  LYMPH: No lymphadenopathy noted of the head and neck NEURO: grossly nonfocal exam, moves all extremities. PSYCH: alert and oriented x 3, normal mood and affect.   All available labs, radiology testing, previous records reviewed. Agree with documented assessment and plan of my colleague as stated above with the following additions or changes:  Principal Problem:   Pleuritic chest pain Active Problems:   Hypertension   Hypertensive urgency   Renal insufficiency, mild   Atypical chest pain    Plan: His symptoms seem most consistent with MSK pain after a strenuous workout. However, his d-dimer is mildly abnormal, so we cannot exclude the possibility of PE. He does drive from [place] to [place] frequently.   If CT PE is negative, we will follow up ESR and CRP. If elevated, consider pericarditis in differential diagnosis. If normal, will presume MSK pain and recommend conservative  measures. CT PE will also help determine if there are musculoskeletal abnormalties over the right axillary line.   Elouise Munroe, [name] 1:10 PM  11/13/2017  ADDENDUM:  CT PE study negative for PE. No other acute cardiovascular pathology noted. Likely musculoskeletal pain vs pneumonia.  [facility] will sign off.   Medication Recommendations:  No change Other recommendations (labs, testing, etc):  none Follow up as an outpatient:  Follow up with PCP for blood pressure management.

## 2017-11-14 DIAGNOSIS — N289 Disorder of kidney and ureter, unspecified: Secondary | ICD-10-CM

## 2017-11-14 LAB — BASIC METABOLIC PANEL
Anion gap: 7 (ref 5–15)
BUN: 17 mg/dL (ref 6–20)
CALCIUM: 9.2 mg/dL (ref 8.9–10.3)
CO2: 25 mmol/L (ref 22–32)
CREATININE: 1.16 mg/dL (ref 0.61–1.24)
Chloride: 108 mmol/L (ref 98–111)
GFR calc non Af Amer: >60 mL/min (ref >60)
Glucose, Bld: 96 mg/dL (ref 70–99)
Potassium: 3.9 mmol/L (ref 3.5–5.1)
Sodium: 140 mmol/L (ref 135–145)

## 2017-11-14 LAB — CBC
HCT: 39.5 % (ref 39.0–52.0)
Hemoglobin: 13.1 g/dL (ref 13.0–17.0)
MCH: 31.3 pg (ref 26.0–34.0)
MCHC: 33.2 g/dL (ref 30.0–36.0)
MCV: 94.3 fL (ref 80.0–100.0)
NRBC: 0 % (ref 0.0–0.2)
Platelets: 254 10*3/uL (ref 150–400)
RBC: 4.19 MIL/uL — ABNORMAL LOW (ref 4.22–5.81)
RDW: 14.1 % (ref 11.5–15.5)
WBC: 4.9 10*3/uL (ref 4.0–10.5)

## 2017-11-14 LAB — MAGNESIUM: Magnesium: 1.9 mg/dL (ref 1.7–2.4)

## 2017-11-14 MED ORDER — AMLODIPINE BESYLATE 10 MG PO TABS: 10.0000 mg | ORAL_TABLET | Freq: Every day | ORAL | 0 refills | Status: DC

## 2017-11-14 MED ORDER — LISINOPRIL 40 MG PO TABS: 40.0000 mg | ORAL_TABLET | Freq: Every day | ORAL | 0 refills | Status: DC

## 2017-11-14 MED ORDER — DOXYCYCLINE HYCLATE 100 MG PO TABS: 100.0000 mg | ORAL_TABLET | Freq: Two times a day (BID) | ORAL | 0 refills | Status: DC

## 2017-11-14 NOTE — Progress Notes (Signed)
Patient received discharge information and acknowledged understanding of it. Patient received printed prescriptions. Patient IV was removed. 

## 2017-11-14 NOTE — Care Management Note (Signed)
Case Management Note  Patient Details  Name: Blake [name] MRN: [id number] Date of Birth: 1955/09/11  Subjective/Objective:  62 yr old gentleman admitted with pleuritic chest pain and hypertensive urgency.                Action/Plan: Case manager arranged appointment for patient at the [facility] and [facility] for Wednesday, November 22, 2017 at 9:30am with Dr. Delford Field. Case manager provided patient with this information and entered it on discharge paper work. Patient's address has also been changed in the sytem, he resides at [address], [place], [place] [address]. Phone number is correct.    Expected Discharge Date:  11/14/17               Expected Discharge Plan:  Home/Self Care  In-House Referral:  NA  Discharge planning Services  CM Consult, Follow-up appt scheduled, [facility]  Post Acute Care Choice:  NA Choice offered to:  NA  DME Arranged:  N/A DME Agency:  NA  HH Arranged:  NA HH Agency:  NA  Status of Service:  Completed, signed off  If discussed at Long Length of Stay Meetings, dates discussed:    Additional Comments:  Durenda Guthrie, RN 11/14/2017, 10:49 AM

## 2017-11-14 NOTE — Discharge Summary (Signed)
Physician Discharge Summary  Blake [name] JWJ:[id number] DOB: 18-Jul-1955 DOA: 11/12/2017  PCP: System, Provider Not In  Admit date: 11/12/2017 Discharge date: 11/14/2017  Admitted From: home Discharge disposition: home   Recommendations for Outpatient Follow-Up:   1. Titration of BP medications    Discharge Diagnosis:   Principal Problem:   Pleuritic chest pain Active Problems:   Hypertension   Hypertensive urgency   Renal insufficiency, mild   Atypical chest pain   CAP (community acquired pneumonia)    Discharge Condition: Improved.  Diet recommendation: Low sodium, heart healthy.  Wound care: None.  Code status: Full.   History of Present Illness:   Blake [name] is an 62 y.o. male with medical history significant of hypertension who is coming to the emergency department from home due to right-sided chest pain associated with dyspnea that started while he was driving home around midnight. The pain is described as sharp, from the right lower sternum and to right lower ribs area. It is described as pleuritic. He mentions that he came home and took a muscle relaxant, but did not have any significant relief.    Hospital Course by Problem:   CAP -PNA seen on CT and corresponds to area of pain -PO doxy -outpatient follow up  Right sided chest pain -? PNA  -did not respond to topical voltaren gel for pain control  HTN -resume medications-- adjust as needed as an outpatient  Atypical CP -CE negative -seen by cardiology -doubt cardiac source  AKI -CR back to normal -encouraged hydration especially when lifting weights/exercising    Medical Consultants:    cards  Discharge Exam:   Vitals:   11/13/17 1945 11/14/17 0605  BP: (!) 143/98 (!) 142/94  Pulse: 89 73  Resp: 18 20  Temp: 98.7 F (37.1 C) 98 F (36.7 C)  SpO2: 97% 95%   Vitals:   11/13/17 1302 11/13/17 1640 11/13/17 1945 11/14/17 0605  BP: (!) 161/96 (!)  145/90 (!) 143/98 (!) 142/94  Pulse:  90 89 73  Resp:  18 18 20   Temp:  98.4 F (36.9 C) 98.7 F (37.1 C) 98 F (36.7 C)  TempSrc:  Oral Oral Oral  SpO2:  95% 97% 95%  Weight:    81.7 kg  Height:        General exam: Appears calm and comfortable.    The results of significant diagnostics from this hospitalization (including imaging, microbiology, ancillary and laboratory) are listed below for reference.     Procedures and Diagnostic Studies:   Dg Chest 2 View  Result Date: 11/12/2017 CLINICAL DATA:  Chest pain.  Hypertension. EXAM: CHEST - 2 VIEW COMPARISON:  None. FINDINGS: Lungs are clear. The heart size and pulmonary vascularity are normal. No adenopathy. No pneumothorax. No bone lesions. IMPRESSION: No edema or consolidation. Electronically Signed   By: Bretta Bang III M.D.   On: 11/12/2017 01:37   Ct Angio Chest Pe W Or Wo Contrast  Result Date: 11/13/2017 CLINICAL DATA:  Chest pain with positive D-dimer study EXAM: CT ANGIOGRAPHY CHEST WITH CONTRAST TECHNIQUE: Multidetector CT imaging of the chest was performed using the standard protocol during bolus administration of intravenous contrast. Multiplanar CT image reconstructions and MIPs were obtained to evaluate the vascular anatomy. CONTRAST:  65 mL ISOVUE-370 IOPAMIDOL (ISOVUE-370) INJECTION 76% COMPARISON:  Chest radiograph November 12, 2017 FINDINGS: Cardiovascular: There is no demonstrable pulmonary embolus. There is no thoracic aortic aneurysm or dissection. The visualized great vessels appear unremarkable.  There are foci of aortic atherosclerosis. There is no pericardial effusion or pericardial thickening evident. Mediastinum/Nodes: Visualized thyroid appears unremarkable. There are scattered subcentimeter mediastinal lymph nodes. There is no adenopathy by size criteria in the thoracic region. No esophageal lesions are evident. Lungs/Pleura: There is atelectatic change in the lung bases posteriorly. There is mild  consolidation in these areas, slightly more on the right than on the left. There is a minimal right pleural effusion. Lungs elsewhere are clear. Upper Abdomen: Visualized upper abdominal structures appear normal. Musculoskeletal: There are no blastic or lytic bone lesions. There are no evident chest wall lesions. Review of the MIP images confirms the above findings. IMPRESSION: 1. No demonstrable pulmonary embolus. No thoracic aortic aneurysm. There is aortic atherosclerosis. 2. Bibasilar atelectasis. Focal consolidation in the posterior lung bases is likely due to atelectasis, although there may be a degree of superimposed pneumonia, particularly in the right base. The lungs elsewhere are clear. Minimal right pleural effusion. 3. Scattered subcentimeter mediastinal lymph nodes without frank adenopathy by size criteria. Aortic Atherosclerosis (ICD10-I70.0). Electronically Signed   By: Bretta Bang III M.D.   On: 11/13/2017 14:09     Labs:   Basic Metabolic Panel: Recent Labs  Lab 11/12/17 0042 11/12/17 0738 11/13/17 0503 11/14/17 0336  NA 140 139 136 140  K 4.0 3.9 3.7 3.9  CL 104 107 105 108  CO2 25 25 25 25   GLUCOSE 86 90 93 96  BUN 15 17 20 17   CREATININE 1.44* 1.31* 1.19 1.16  CALCIUM 9.8 9.1 9.2 9.2  MG  --   --  1.8 1.9   GFR Estimated Creatinine Clearance: 68.6 mL/min (by C-G formula based on SCr of 1.16 mg/dL). Liver Function Tests: Recent Labs  Lab 11/12/17 0738  AST 24  ALT 22  ALKPHOS 52  BILITOT 0.6  PROT 6.7  ALBUMIN 3.8   No results for input(s): LIPASE, AMYLASE in the last 168 hours. No results for input(s): AMMONIA in the last 168 hours. Coagulation profile No results for input(s): INR, PROTIME in the last 168 hours.  CBC: Recent Labs  Lab 11/12/17 0042 11/12/17 0738 11/13/17 0503 11/14/17 0336  WBC 5.3 5.5 4.5 4.9  HGB 13.1 13.0 13.1 13.1  HCT 40.1 40.0 39.7 39.5  MCV 95.9 96.4 94.1 94.3  PLT 264 220 263 254   Cardiac Enzymes: Recent Labs    Lab 11/12/17 0738 11/13/17 1118  TROPONINI <0.03 <0.03   BNP: Invalid input(s): POCBNP CBG: Recent Labs  Lab 11/13/17 1035  GLUCAP 109*   D-Dimer Recent Labs    11/13/17 1122  DDIMER 0.71*   Hgb A1c No results for input(s): HGBA1C in the last 72 hours. Lipid Profile Recent Labs    11/13/17 1118  CHOL 212*  HDL 64  LDLCALC 135*  TRIG 65  CHOLHDL 3.3   Thyroid function studies Recent Labs    11/12/17 0046  TSH 0.847   Anemia work up No results for input(s): VITAMINB12, FOLATE, FERRITIN, TIBC, IRON, RETICCTPCT in the last 72 hours. Microbiology No results found for this or any previous visit (from the past 240 hour(s)).   Discharge Instructions:   Discharge Instructions    Diet - low sodium heart healthy   Complete by:  As directed    Increase activity slowly   Complete by:  As directed      Allergies as of 11/14/2017      Reactions   Shellfish Allergy Hives, Nausea And Vomiting  Medication List    STOP taking these medications   ciprofloxacin 500 MG tablet Commonly known as:  CIPRO   doxycycline 100 MG capsule Commonly known as:  VIBRAMYCIN Replaced by:  doxycycline 100 MG tablet   ibuprofen 200 MG tablet Commonly known as:  ADVIL,MOTRIN     TAKE these medications   amLODipine 10 MG tablet Commonly known as:  NORVASC Take 1 tablet (10 mg total) by mouth daily.   aspirin EC 81 MG tablet Take 81 mg by mouth daily as needed ("for heart health").   doxycycline 100 MG tablet Commonly known as:  VIBRA-TABS Take 1 tablet (100 mg total) by mouth every 12 (twelve) hours. Replaces:  doxycycline 100 MG capsule   lisinopril 40 MG tablet Commonly known as:  PRINIVIL,ZESTRIL Take 1 tablet (40 mg total) by mouth daily.   multivitamin Tabs tablet Take 1 tablet by mouth daily.         Time coordinating discharge: 25 min  Signed:  Joseph Art DO  [facility] 11/14/2017, 7:51 AM

## 2017-11-22 ENCOUNTER — Ambulatory Visit: Payer: PRIVATE HEALTH INSURANCE | Attending: Critical Care Medicine | Admitting: Critical Care Medicine

## 2017-11-22 ENCOUNTER — Encounter: Payer: Self-pay | Admitting: Critical Care Medicine

## 2017-11-22 VITALS — BP 123/75 | HR 75 | Temp 97.6°F | Resp 18 | Ht 69.0 in | Wt 185.0 lb

## 2017-11-22 DIAGNOSIS — J181 Lobar pneumonia, unspecified organism: Secondary | ICD-10-CM | POA: Diagnosis not present

## 2017-11-22 DIAGNOSIS — Z7982 Long term (current) use of aspirin: Secondary | ICD-10-CM | POA: Insufficient documentation

## 2017-11-22 DIAGNOSIS — I10 Essential (primary) hypertension: Secondary | ICD-10-CM | POA: Diagnosis not present

## 2017-11-22 DIAGNOSIS — I160 Hypertensive urgency: Secondary | ICD-10-CM

## 2017-11-22 DIAGNOSIS — R0789 Other chest pain: Secondary | ICD-10-CM | POA: Diagnosis not present

## 2017-11-22 DIAGNOSIS — Z79899 Other long term (current) drug therapy: Secondary | ICD-10-CM | POA: Insufficient documentation

## 2017-11-22 DIAGNOSIS — R0781 Pleurodynia: Secondary | ICD-10-CM

## 2017-11-22 DIAGNOSIS — Z91013 Allergy to seafood: Secondary | ICD-10-CM | POA: Insufficient documentation

## 2017-11-22 DIAGNOSIS — J189 Pneumonia, unspecified organism: Secondary | ICD-10-CM

## 2017-11-22 MED ORDER — AMLODIPINE BESYLATE 10 MG PO TABS: 10.0000 mg | ORAL_TABLET | Freq: Every day | ORAL | 6 refills | Status: DC

## 2017-11-22 MED ORDER — LISINOPRIL 40 MG PO TABS: 40.0000 mg | ORAL_TABLET | Freq: Every day | ORAL | 6 refills | Status: DC

## 2017-11-22 NOTE — Assessment & Plan Note (Signed)
Review hypertensive urgency assessment Patient will continue current blood pressure medications and refills were sent

## 2017-11-22 NOTE — Progress Notes (Signed)
Subjective:    Patient ID: Blake [name], male    DOB: [birth year], 62 y.o.   MRN: [id number]  This is a 62 year old male who was seen in the hospital between the 27th and 29 October for right-sided community-acquired pneumonia and associated pleuritic pain.  There was also hypertension poorly controlled.  The patient was discharged with doxycycline along with ongoing use of lisinopril and amlodipine for blood pressure control. Cardiology evaluation during this admission was unremarkable.  Patient returns today for post hospital follow-up.  Chest Pain   This is a new problem. The current episode started in the past 7 days (chest pain started 11/1 in evening). The onset quality is gradual. The problem has been rapidly improving (at first caused pt to take shallow breaths, worse iwth deep breath). The pain is present in the lateral region. The pain is at a severity of 5/10. The pain is moderate. The quality of the pain is described as sharp. The pain does not radiate. Associated symptoms include back pain, a cough, malaise/fatigue and nausea. Pertinent negatives include no diaphoresis, exertional chest pressure, fever, headaches, hemoptysis, irregular heartbeat, leg pain, lower extremity edema, orthopnea, palpitations, PND, shortness of breath, sputum production, vomiting or weakness. The cough has no precipitants. The pain is aggravated by deep breathing, breathing and coughing.  Pertinent negatives for past medical history include no CAD, no PE and no PVD.   Past Medical History:  Diagnosis Date  . Hypertension      Family History  Problem Relation Age of Onset  . Cancer Mother        breast  . Hypertension Father      Social History   Socioeconomic History  . Marital status: Single    Spouse name: Not on file  . Number of children: Not on file  . Years of education: Not on file  . Highest education level: Not on file  Occupational History  . Not on file  Social Needs  .  Financial resource strain: Not on file  . Food insecurity:    Worry: Not on file    Inability: Not on file  . Transportation needs:    Medical: Not on file    Non-medical: Not on file  Tobacco Use  . Smoking status: Never Smoker  . Smokeless tobacco: Never Used  Substance and Sexual Activity  . Alcohol use: No  . Drug use: No  . Sexual activity: Not Currently  Lifestyle  . Physical activity:    Days per week: Not on file    Minutes per session: Not on file  . Stress: Not on file  Relationships  . Social connections:    Talks on phone: Not on file    Gets together: Not on file    Attends religious service: Not on file    Active member of club or organization: Not on file    Attends meetings of clubs or organizations: Not on file    Relationship status: Not on file  . Intimate partner violence:    Fear of current or ex partner: Not on file    Emotionally abused: Not on file    Physically abused: Not on file    Forced sexual activity: Not on file  Other Topics Concern  . Not on file  Social History Narrative  . Not on file     Allergies  Allergen Reactions  . Shellfish Allergy Hives and Nausea And Vomiting     Outpatient Medications Prior to Visit  Medication Sig Dispense Refill  . aspirin EC 81 MG tablet Take 81 mg by mouth daily as needed ("for heart health").    . multivitamin (ONE-A-DAY MEN'S) TABS tablet Take 1 tablet by mouth daily.    Marland Kitchen amLODipine (NORVASC) 10 MG tablet Take 1 tablet (10 mg total) by mouth daily. 30 tablet 0  . lisinopril (PRINIVIL,ZESTRIL) 40 MG tablet Take 1 tablet (40 mg total) by mouth daily. 30 tablet 0  . doxycycline (VIBRA-TABS) 100 MG tablet Take 1 tablet (100 mg total) by mouth every 12 (twelve) hours. 10 tablet 0   No facility-administered medications prior to visit.       Review of Systems  Constitutional: Positive for malaise/fatigue. Negative for diaphoresis and fever.  Respiratory: Positive for cough. Negative for  hemoptysis, sputum production and shortness of breath.   Cardiovascular: Positive for chest pain. Negative for palpitations, orthopnea and PND.  Gastrointestinal: Positive for nausea. Negative for vomiting.  Musculoskeletal: Positive for back pain.  Neurological: Negative for weakness and headaches.       Objective:   Physical Exam  Vitals:   11/22/17 0940  BP: 123/75  Pulse: 75  Resp: 18  Temp: 97.6 F (36.4 C)  TempSrc: Oral  SpO2: 97%  Weight: 185 lb (83.9 kg)  Height: 5\' 9"  (1.753 m)    Gen: Pleasant, well-nourished, in no distress,  normal affect  ENT: No lesions,  mouth clear,  oropharynx clear, no postnasal drip  Neck: No JVD, no TMG, no carotid bruits  Lungs: No use of accessory muscles, no dullness to percussion, clear without rales or rhonchi  Cardiovascular: RRR, heart sounds normal, no murmur or gallops, no peripheral edema  Abdomen: soft and NT, no HSM,  BS normal  Musculoskeletal: No deformities, no cyanosis or clubbing  Neuro: alert, non focal  Skin: Warm, no lesions or rashes  No results found.  CBC Latest Ref Rng & Units 11/14/2017 11/13/2017 11/12/2017  WBC 4.0 - 10.5 K/uL 4.9 4.5 5.5  Hemoglobin 13.0 - 17.0 g/dL 40.9 81.1 91.4  Hematocrit 39.0 - 52.0 % 39.5 39.7 40.0  Platelets 150 - 400 K/uL 254 263 220   BMP Latest Ref Rng & Units 11/14/2017 11/13/2017 11/12/2017  Glucose 70 - 99 mg/dL 96 93 90  BUN 6 - 20 mg/dL 17 20 17   Creatinine 0.61 - 1.24 mg/dL 7.82 9.56 2.13(Y)  Sodium 135 - 145 mmol/L 140 136 139  Potassium 3.5 - 5.1 mmol/L 3.9 3.7 3.9  Chloride 98 - 111 mmol/L 108 105 107  CO2 22 - 32 mmol/L 25 25 25   Calcium 8.9 - 10.3 mg/dL 9.2 9.2 9.1    86/57 ECG: NSR no acute changes 10/27 trop:<  0.03 10/27 Echo: Study Conclusions  - Left ventricle: The cavity size was normal. Systolic function was   normal. The estimated ejection fraction was in the range of 60%   to 65%. Wall motion was normal; there were no regional wall    motion abnormalities. Doppler parameters are consistent with   abnormal left ventricular relaxation (grade 1 diastolic   dysfunction). - Mitral valve: Valve area by pressure half-time: 2.44 cm^2. - Atrial septum: There was a possible, small patent foramen ovale.  10/27 Ct Chest angio IMPRESSION: 1. No demonstrable pulmonary embolus. No thoracic aortic aneurysm. There is aortic atherosclerosis.  2. Bibasilar atelectasis. Focal consolidation in the posterior lung bases is likely due to atelectasis, although there may be a degree of superimposed pneumonia, particularly in the right base. The lungs  elsewhere are clear. Minimal right pleural effusion.  3. Scattered subcentimeter mediastinal lymph nodes without frank adenopathy by size criteria.  Aortic Atherosclerosis (ICD10-I70.0).       Assessment & Plan:  I personally reviewed all images and lab data in the [facility] system as well as any outside material available during this office visit and agree with the  radiology impressions.   Hypertensive urgency Hypertensive urgency has resolved Plan Continue amlodipine 10 mg daily Continue lisinopril 40 mg daily Establish with primary care   Hypertension Review hypertensive urgency assessment Patient will continue current blood pressure medications and refills were sent  CAP (community acquired pneumonia) I suspect this patient had pleurisy on the basis of a right lower lobe community acquired pneumonia with atypical infection There may also been a viral component The patient is responded to doxycycline well Plan No further antibiotics indicated No need for further imaging  Atypical chest pain Atypical chest pain was pleuritic in nature and due to community-acquired pneumonia.  Cardiac evaluation was negative including normal EKG, negative cardiac enzymes, and normal echocardiogram  Pleuritic chest pain Pleuritic chest pain, review atypical chest pain assessment   Blake [name]  was seen today for hospitalization follow-up.  Diagnoses and all orders for this visit:  Community acquired pneumonia of right lower lobe of lung (HCC)  Essential hypertension  Hypertensive urgency  Atypical chest pain  Pleuritic chest pain  Other orders -     lisinopril (PRINIVIL,ZESTRIL) 40 MG tablet; Take 1 tablet (40 mg total) by mouth daily. -     amLODipine (NORVASC) 10 MG tablet; Take 1 tablet (10 mg total) by mouth daily.  I did recommend a flu vaccine today the patient wishes to defer this until he is feeling better  The patient has been released back to work

## 2017-11-22 NOTE — Patient Instructions (Signed)
Continue amlodipine and lisinopril as currently prescribed You may take Tylenol or ibuprofen as needed for pain We will schedule you for a primary care visit to establish within the next 2 months Please consider a flu vaccine A financial assistance visit will also be scheduled

## 2017-11-22 NOTE — Assessment & Plan Note (Signed)
Pleuritic chest pain, review atypical chest pain assessment

## 2017-11-22 NOTE — Assessment & Plan Note (Signed)
Atypical chest pain was pleuritic in nature and due to community-acquired pneumonia.  Cardiac evaluation was negative including normal EKG, negative cardiac enzymes, and normal echocardiogram

## 2017-11-22 NOTE — Assessment & Plan Note (Signed)
I suspect this patient had pleurisy on the basis of a right lower lobe community acquired pneumonia with atypical infection There may also been a viral component The patient is responded to doxycycline well Plan No further antibiotics indicated No need for further imaging

## 2017-11-22 NOTE — Assessment & Plan Note (Signed)
Hypertensive urgency has resolved Plan Continue amlodipine 10 mg daily Continue lisinopril 40 mg daily Establish with primary care

## 2017-12-12 MED FILL — AMLODIPINE BESYLATE 10 MG T: 10 | 30 days supply | Qty: 30 | Fill #0

## 2017-12-12 MED FILL — LISINOPRIL 40 MG TABLET: 40 | 30 days supply | Qty: 30 | Fill #0

## 2018-01-15 MED FILL — AMLODIPINE BESYLATE 10 MG T: 10 | 30 days supply | Qty: 30 | Fill #1

## 2018-01-15 MED FILL — LISINOPRIL 40 MG TABLET: 40 | 30 days supply | Qty: 30 | Fill #1

## 2018-01-23 ENCOUNTER — Ambulatory Visit: Payer: PRIVATE HEALTH INSURANCE | Admitting: Family Medicine

## 2018-01-23 ENCOUNTER — Encounter: Payer: Self-pay | Admitting: Family Medicine

## 2018-01-23 ENCOUNTER — Ambulatory Visit: Payer: Self-pay | Attending: Family Medicine | Admitting: Family Medicine

## 2018-01-23 VITALS — BP 119/82 | HR 71 | Temp 98.2°F | Resp 18 | Ht 69.0 in | Wt 185.0 lb

## 2018-01-23 DIAGNOSIS — I10 Essential (primary) hypertension: Secondary | ICD-10-CM

## 2018-01-23 DIAGNOSIS — R0789 Other chest pain: Secondary | ICD-10-CM

## 2018-01-23 DIAGNOSIS — Z803 Family history of malignant neoplasm of breast: Secondary | ICD-10-CM | POA: Insufficient documentation

## 2018-01-23 DIAGNOSIS — Z91013 Allergy to seafood: Secondary | ICD-10-CM | POA: Insufficient documentation

## 2018-01-23 DIAGNOSIS — Z79899 Other long term (current) drug therapy: Secondary | ICD-10-CM

## 2018-01-23 DIAGNOSIS — Z8249 Family history of ischemic heart disease and other diseases of the circulatory system: Secondary | ICD-10-CM | POA: Insufficient documentation

## 2018-01-23 MED ORDER — AMLODIPINE BESYLATE 10 MG PO TABS: 10.0000 mg | ORAL_TABLET | Freq: Every day | ORAL | 6 refills | Status: AC

## 2018-01-23 MED ORDER — LISINOPRIL 40 MG PO TABS: 40.0000 mg | ORAL_TABLET | Freq: Every day | ORAL | 6 refills | Status: AC

## 2018-01-23 NOTE — Patient Instructions (Signed)
DASH Eating Plan  DASH stands for "Dietary Approaches to Stop Hypertension." The DASH eating plan is a healthy eating plan that has been shown to reduce high blood pressure (hypertension). It may also reduce your risk for type 2 diabetes, heart disease, and stroke. The DASH eating plan may also help with weight loss.  What are tips for following this plan?    General guidelines   Avoid eating more than 2,300 mg (milligrams) of salt (sodium) a day. If you have hypertension, you may need to reduce your sodium intake to 1,500 mg a day.   Limit alcohol intake to no more than 1 drink a day for nonpregnant women and 2 drinks a day for men. One drink equals 12 oz of beer, 5 oz of wine, or 1 oz of hard liquor.   Work with your health care provider to maintain a healthy body weight or to lose weight. Ask what an ideal weight is for you.   Get at least 30 minutes of exercise that causes your heart to beat faster (aerobic exercise) most days of the week. Activities may include walking, swimming, or biking.   Work with your health care provider or diet and nutrition specialist (dietitian) to adjust your eating plan to your individual calorie needs.  Reading food labels     Check food labels for the amount of sodium per serving. Choose foods with less than 5 percent of the Daily Value of sodium. Generally, foods with less than 300 mg of sodium per serving fit into this eating plan.   To find whole grains, look for the word "whole" as the first word in the ingredient list.  Shopping   Buy products labeled as "low-sodium" or "no salt added."   Buy fresh foods. Avoid canned foods and premade or frozen meals.  Cooking   Avoid adding salt when cooking. Use salt-free seasonings or herbs instead of table salt or sea salt. Check with your health care provider or pharmacist before using salt substitutes.   Do not fry foods. Cook foods using healthy methods such as baking, boiling, grilling, and broiling instead.   Cook with  heart-healthy oils, such as olive, canola, soybean, or sunflower oil.  Meal planning   Eat a balanced diet that includes:  ? 5 or more servings of fruits and vegetables each day. At each meal, try to fill half of your plate with fruits and vegetables.  ? Up to 6-8 servings of whole grains each day.  ? Less than 6 oz of lean meat, poultry, or fish each day. A 3-oz serving of meat is about the same size as a deck of cards. One egg equals 1 oz.  ? 2 servings of low-fat dairy each day.  ? A serving of nuts, seeds, or beans 5 times each week.  ? Heart-healthy fats. Healthy fats called Omega-3 fatty acids are found in foods such as flaxseeds and coldwater fish, like sardines, salmon, and mackerel.   Limit how much you eat of the following:  ? Canned or prepackaged foods.  ? Food that is high in trans fat, such as fried foods.  ? Food that is high in saturated fat, such as fatty meat.  ? Sweets, desserts, sugary drinks, and other foods with added sugar.  ? Full-fat dairy products.   Do not salt foods before eating.   Try to eat at least 2 vegetarian meals each week.   Eat more home-cooked food and less restaurant, buffet, and fast food.     When eating at a restaurant, ask that your food be prepared with less salt or no salt, if possible.  What foods are recommended?  The items listed may not be a complete list. Talk with your dietitian about what dietary choices are best for you.  Grains  Whole-grain or whole-wheat bread. Whole-grain or whole-wheat pasta. Brown rice. Oatmeal. Quinoa. Bulgur. Whole-grain and low-sodium cereals. Pita bread. Low-fat, low-sodium crackers. Whole-wheat flour tortillas.  Vegetables  Fresh or frozen vegetables (raw, steamed, roasted, or grilled). Low-sodium or reduced-sodium tomato and vegetable juice. Low-sodium or reduced-sodium tomato sauce and tomato paste. Low-sodium or reduced-sodium canned vegetables.  Fruits  All fresh, dried, or frozen fruit. Canned fruit in natural juice (without  added sugar).  Meat and other protein foods  Skinless chicken or turkey. Ground chicken or turkey. Pork with fat trimmed off. Fish and seafood. Egg whites. Dried beans, peas, or lentils. Unsalted nuts, nut butters, and seeds. Unsalted canned beans. Lean cuts of beef with fat trimmed off. Low-sodium, lean deli meat.  Dairy  Low-fat (1%) or fat-free (skim) milk. Fat-free, low-fat, or reduced-fat cheeses. Nonfat, low-sodium ricotta or cottage cheese. Low-fat or nonfat yogurt. Low-fat, low-sodium cheese.  Fats and oils  Soft margarine without trans fats. Vegetable oil. Low-fat, reduced-fat, or light mayonnaise and salad dressings (reduced-sodium). Canola, safflower, olive, soybean, and sunflower oils. Avocado.  Seasoning and other foods  Herbs. Spices. Seasoning mixes without salt. Unsalted popcorn and pretzels. Fat-free sweets.  What foods are not recommended?  The items listed may not be a complete list. Talk with your dietitian about what dietary choices are best for you.  Grains  Baked goods made with fat, such as croissants, muffins, or some breads. Dry pasta or rice meal packs.  Vegetables  Creamed or fried vegetables. Vegetables in a cheese sauce. Regular canned vegetables (not low-sodium or reduced-sodium). Regular canned tomato sauce and paste (not low-sodium or reduced-sodium). Regular tomato and vegetable juice (not low-sodium or reduced-sodium). Pickles. Olives.  Fruits  Canned fruit in a light or heavy syrup. Fried fruit. Fruit in cream or butter sauce.  Meat and other protein foods  Fatty cuts of meat. Ribs. Fried meat. Bacon. Sausage. Bologna and other processed lunch meats. Salami. Fatback. Hotdogs. Bratwurst. Salted nuts and seeds. Canned beans with added salt. Canned or smoked fish. Whole eggs or egg yolks. Chicken or turkey with skin.  Dairy  Whole or 2% milk, cream, and half-and-half. Whole or full-fat cream cheese. Whole-fat or sweetened yogurt. Full-fat cheese. Nondairy creamers. Whipped toppings.  Processed cheese and cheese spreads.  Fats and oils  Butter. Stick margarine. Lard. Shortening. Ghee. Bacon fat. Tropical oils, such as coconut, palm kernel, or palm oil.  Seasoning and other foods  Salted popcorn and pretzels. Onion salt, garlic salt, seasoned salt, table salt, and sea salt. Worcestershire sauce. Tartar sauce. Barbecue sauce. Teriyaki sauce. Soy sauce, including reduced-sodium. Steak sauce. Canned and packaged gravies. Fish sauce. Oyster sauce. Cocktail sauce. Horseradish that you find on the shelf. Ketchup. Mustard. Meat flavorings and tenderizers. Bouillon cubes. Hot sauce and Tabasco sauce. Premade or packaged marinades. Premade or packaged taco seasonings. Relishes. Regular salad dressings.  Where to find more information:   National Heart, Lung, and Blood Institute: www.nhlbi.nih.gov   American Heart Association: www.heart.org  Summary   The DASH eating plan is a healthy eating plan that has been shown to reduce high blood pressure (hypertension). It may also reduce your risk for type 2 diabetes, heart disease, and stroke.   With the   DASH eating plan, you should limit salt (sodium) intake to 2,300 mg a day. If you have hypertension, you may need to reduce your sodium intake to 1,500 mg a day.   When on the DASH eating plan, aim to eat more fresh fruits and vegetables, whole grains, lean proteins, low-fat dairy, and heart-healthy fats.   Work with your health care provider or diet and nutrition specialist (dietitian) to adjust your eating plan to your individual calorie needs.  This information is not intended to replace advice given to you by your health care provider. Make sure you discuss any questions you have with your health care provider.  Document Released: 12/23/2010 Document Revised: 12/28/2015 Document Reviewed: 12/28/2015  Elsevier Interactive Patient Education  2019 [employer] Inc[employer]

## 2018-01-23 NOTE — Progress Notes (Signed)
Subjective:    Patient ID: Blake [name], male    DOB: 03/26/1955, 63 y.o.   MRN: [id number]  HPI        63 year old male who was last seen at the office on 11/22/2017 by another provider in follow-up of hospitalization on 11/12/2017 through 11/14/2017 for pleuritic chest pain, hypertensive urgency, mild renal insufficiency, atypical chest pain and community-acquired pneumonia.  Patient was prescribed doxycycline at the emergency department.      Patient reports that he has taken his blood pressure medication daily.  Patient has had no headaches or dizziness related to his blood pressure.  Patient denies any shortness of breath or cough.  Patient with some mild nasal congestion.  Patient denies any fever or chills.  Patient does have some mild discomfort in the right chest wall along the chest/abdominal, area of the diaphragm.  Patient reports that this is a dull ache that is very minimal about a 2-3 on a 0-to-10 scale and he generally feels this discomfort when he bends over and then straightens up.  Overall patient feels well.  Patient is following a healthy diet and exercising on a regular basis.  Past Medical History:  Diagnosis Date  . Hypertension    Family History  Problem Relation Age of Onset  . Cancer Mother        breast  . Hypertension Father    Social History   Tobacco Use  . Smoking status: Never Smoker  . Smokeless tobacco: Never Used  Substance Use Topics  . Alcohol use: No  . Drug use: No   Allergies  Allergen Reactions  . Shellfish Allergy Hives and Nausea And Vomiting      Review of Systems  Constitutional: Negative for chills, diaphoresis, fatigue and fever.  HENT: Positive for congestion. Negative for postnasal drip, rhinorrhea, sore throat and trouble swallowing.   Respiratory: Negative for cough and shortness of breath.   Cardiovascular: Positive for chest pain (Occasional mild discomfort in the right lower chest). Negative for palpitations and leg  swelling.  Gastrointestinal: Negative for abdominal pain, blood in stool, constipation, diarrhea and nausea.  Endocrine: Negative for polydipsia, polyphagia and polyuria.  Genitourinary: Negative for dysuria and frequency.  Musculoskeletal: Negative for arthralgias, back pain, gait problem, joint swelling and myalgias.  Neurological: Negative for dizziness and headaches.  Hematological: Negative for adenopathy. Does not bruise/bleed easily.       Objective:   Physical Exam BP 119/82 (BP Location: Left Arm, Patient Position: Sitting, Cuff Size: Normal)   Pulse 71   Temp 98.2 F (36.8 C) (Oral)   Resp 18   Ht 5\' 9"  (1.753 m)   Wt 185 lb (83.9 kg)   SpO2 96%   BMI 27.32 kg/m Nurse's notes and vital signs reviewed General-well-nourished, well-developed male in no acute distress who appears younger than stated age ENT-TMs dull, mild edema of the nasal turbinates, mild posterior pharynx erythema Neck-supple, no lymphadenopathy, no thyromegaly, no carotid bruit Lungs-clear to auscultation bilaterally Cardiovascular-regular rate and rhythm Chest-no reproducible chest pain Back-no CVA tenderness, no lumbosacral tenderness to palpation Extremities-no edema         Assessment & Plan:  1. Essential hypertension Patient's blood pressure appears well-controlled at today's visit, patient given new prescriptions for lisinopril and amlodipine to continue for control of blood pressure.  Also continue healthy, low-sodium diet.  Patient will have CMP in follow-up of long-term use of medications as well as his atypical right-sided chest pain.  Patient did have mild  increase in alkaline phosphatase on prior labs but creatinine was within normal. - lisinopril (PRINIVIL,ZESTRIL) 40 MG tablet; Take 1 tablet (40 mg total) by mouth daily.  Dispense: 30 tablet; Refill: 6 - amLODipine (NORVASC) 10 MG tablet; Take 1 tablet (10 mg total) by mouth daily.  Dispense: 30 tablet; Refill: 6 - Comprehensive  metabolic panel  2. Encounter for long-term (current) use of medications Patient will have CMP in follow-up of long-term use of medications - Comprehensive metabolic panel  3. Atypical chest pain Patient with atypical chest pain.  Patient will have CMP as patient is having pain in the lower chest and the diaphragm area and the liver is just beneath the area of his discomfort but no reproducible pain on exam.  Patient should call or return or seek follow-up at the emergency department if he has worsening of the chest pain or any concerns - Comprehensive metabolic panel  An After Visit Summary was printed and given to the patient. Allergies as of 01/23/2018      Reactions   Shellfish Allergy Hives, Nausea And Vomiting      Medication List       Accurate as of January 23, 2018  6:45 PM. Always use your most recent med list.        amLODipine 10 MG tablet Commonly known as:  NORVASC Take 1 tablet (10 mg total) by mouth daily.   aspirin EC 81 MG tablet Take 81 mg by mouth daily as needed ("for heart health").   lisinopril 40 MG tablet Commonly known as:  PRINIVIL,ZESTRIL Take 1 tablet (40 mg total) by mouth daily.   multivitamin Tabs tablet Take 1 tablet by mouth daily.       Return in about 4 months (around 05/24/2018) for hypertension.

## 2018-01-24 LAB — COMPREHENSIVE METABOLIC PANEL WITH GFR
ALT: 21 IU/L (ref 0–44)
AST: 25 IU/L (ref 0–40)
Albumin/Globulin Ratio: 1.7 (ref 1.2–2.2)
Albumin: 4.6 g/dL (ref 3.6–4.8)
Alkaline Phosphatase: 68 IU/L (ref 39–117)
BUN/Creatinine Ratio: 12 (ref 10–24)
BUN: 14 mg/dL (ref 8–27)
Bilirubin Total: 0.3 mg/dL (ref 0.0–1.2)
CO2: 23 mmol/L (ref 20–29)
Calcium: 9.5 mg/dL (ref 8.6–10.2)
Chloride: 101 mmol/L (ref 96–106)
Creatinine, Ser: 1.16 mg/dL (ref 0.76–1.27)
GFR calc Af Amer: 79 mL/min/1.73
GFR calc non Af Amer: 68 mL/min/1.73
Globulin, Total: 2.7 g/dL (ref 1.5–4.5)
Glucose: 99 mg/dL (ref 65–99)
Potassium: 4.3 mmol/L (ref 3.5–5.2)
Sodium: 142 mmol/L (ref 134–144)
Total Protein: 7.3 g/dL (ref 6.0–8.5)

## 2018-01-30 ENCOUNTER — Telehealth: Payer: Self-pay | Admitting: *Deleted

## 2018-01-30 NOTE — Telephone Encounter (Signed)
-----   Message from Cain Saupe, MD sent at 01/24/2018  8:57 AM EST ----- Please notify patient of normal CMET-electrolytes and liver function tests

## 2018-01-30 NOTE — Telephone Encounter (Signed)
Medical Assistant left message on patient's home and cell voicemail. Voicemail states to give a call back to Takako Minckler with [facility] at [phone].  

## 2018-02-06 ENCOUNTER — Telehealth: Payer: Self-pay | Admitting: *Deleted

## 2018-02-06 NOTE — Telephone Encounter (Signed)
Patient verified DOB ?Patient is aware of labs being normal. ? ?

## 2018-03-01 MED FILL — AMLODIPINE BESYLATE 10 MG T: 10 | 30 days supply | Qty: 30 | Fill #2

## 2018-03-01 MED FILL — LISINOPRIL 40 MG TABLET: 40 | 30 days supply | Qty: 30 | Fill #2

## 2018-04-13 MED FILL — LISINOPRIL 40 MG TABLET: 40 | 30 days supply | Qty: 30 | Fill #3

## 2018-04-13 MED FILL — AMLODIPINE BESYLATE 10 MG T: 10 | 30 days supply | Qty: 30 | Fill #3

## 2018-06-07 MED FILL — LISINOPRIL 40 MG TABLET: 40 | 30 days supply | Qty: 30 | Fill #4

## 2018-06-07 MED FILL — AMLODIPINE BESYLATE 10 MG T: 10 | 30 days supply | Qty: 30 | Fill #4

## 2018-07-17 MED FILL — LISINOPRIL 40 MG TAB: 40 | 30 days supply | Qty: 30 | Fill #5

## 2018-07-17 MED FILL — AMLODIPINE BESYLATE 10 MG T: 10 | 30 days supply | Qty: 30 | Fill #5

## 2018-08-27 MED FILL — AMLODIPINE BESYLATE 10 MG T: 10 | 30 days supply | Qty: 30 | Fill #6

## 2018-08-27 MED FILL — LISINOPRIL 40 MG TABLET: 40 | 30 days supply | Qty: 30 | Fill #6

## 2018-10-01 MED FILL — AMLODIPINE BESYLATE 10 MG T: 10 | 30 days supply | Qty: 30 | Fill #0

## 2018-10-01 MED FILL — LISINOPRIL 40 MG TABLET: 40 | 30 days supply | Qty: 30 | Fill #0

## 2018-12-12 MED FILL — AMLODIPINE BESYLATE 10 MG T: 10 | 30 days supply | Qty: 30 | Fill #1

## 2018-12-12 MED FILL — LISINOPRIL 40 MG TABLET: 40 | 30 days supply | Qty: 30 | Fill #1

## 2019-05-21 IMAGING — CR DG CHEST 2V
2 series · 2 of 2 positions shown · non-contrast
Comparison: None.

CLINICAL DATA: Chest pain.  Hypertension.

EXAM:
CHEST - 2 VIEW

[chest pa]
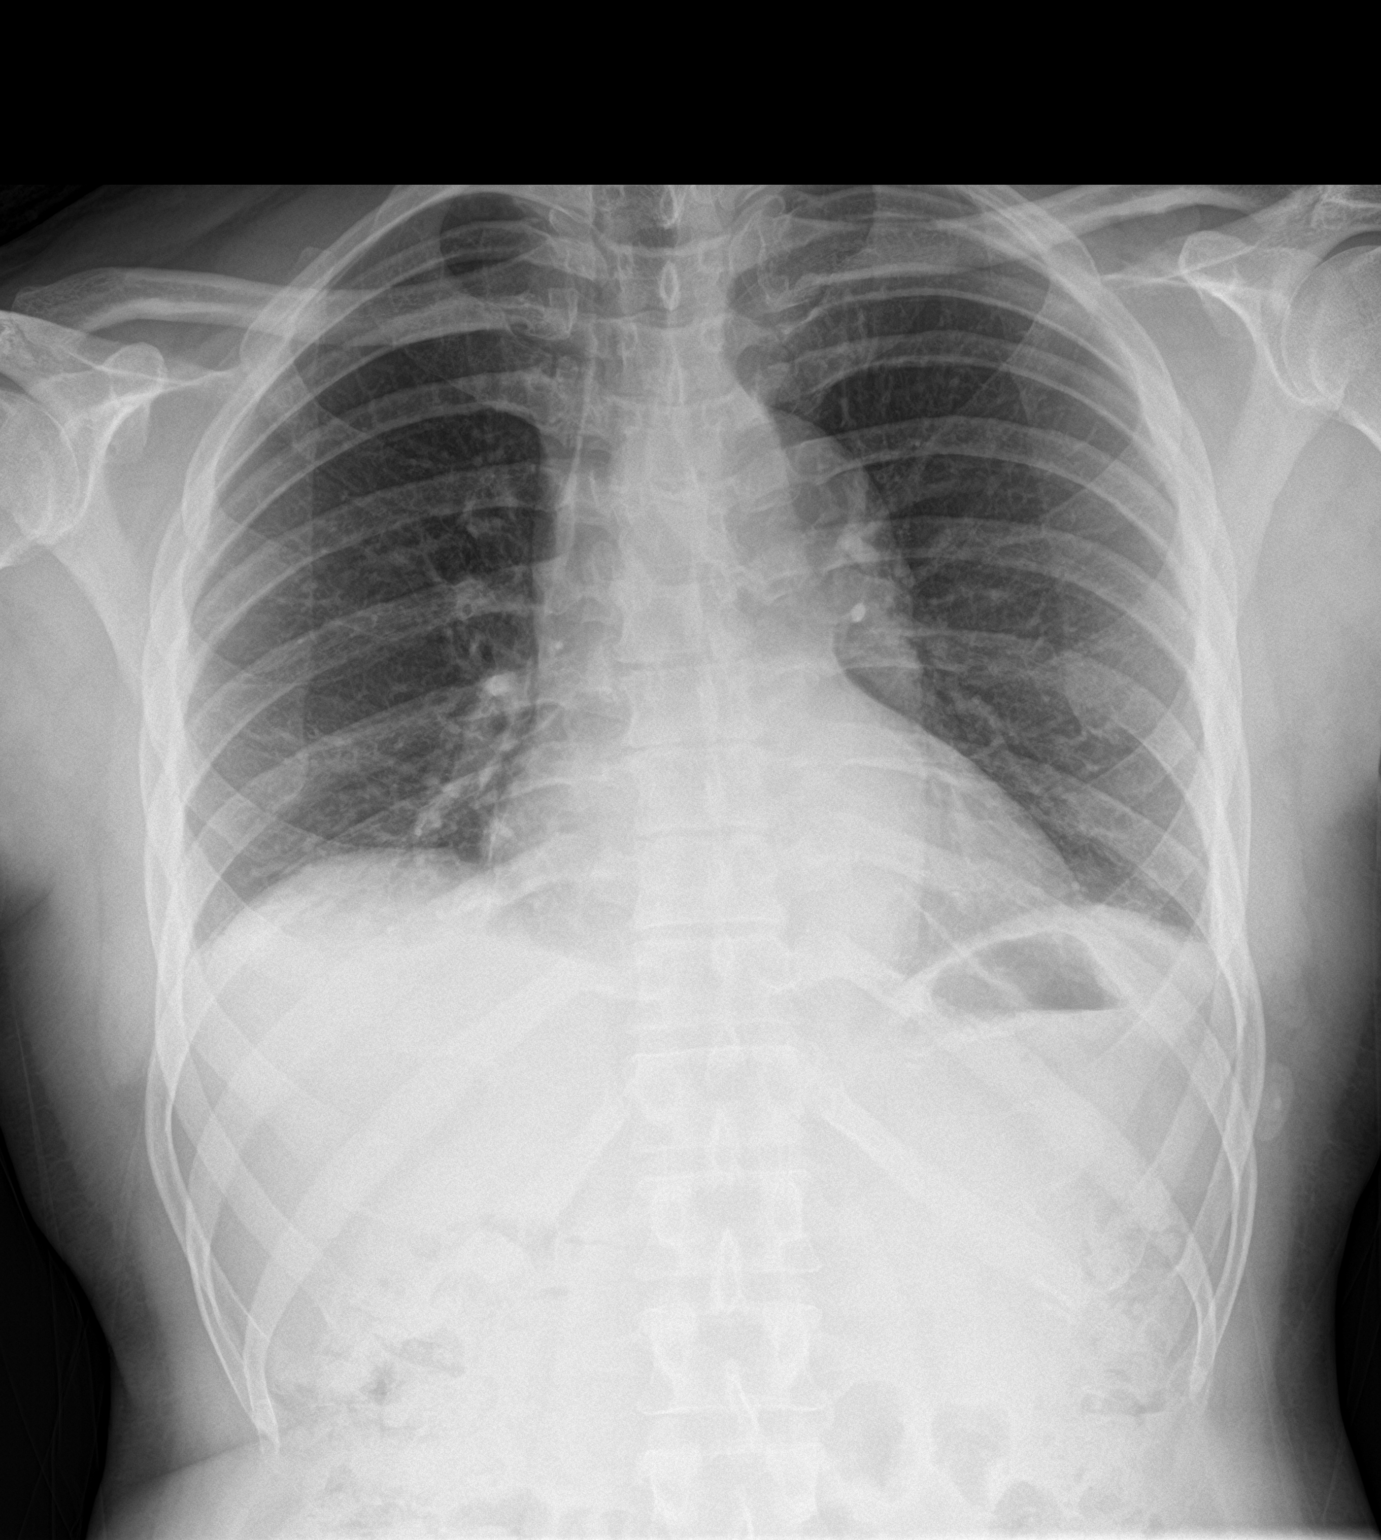

[chest lat]
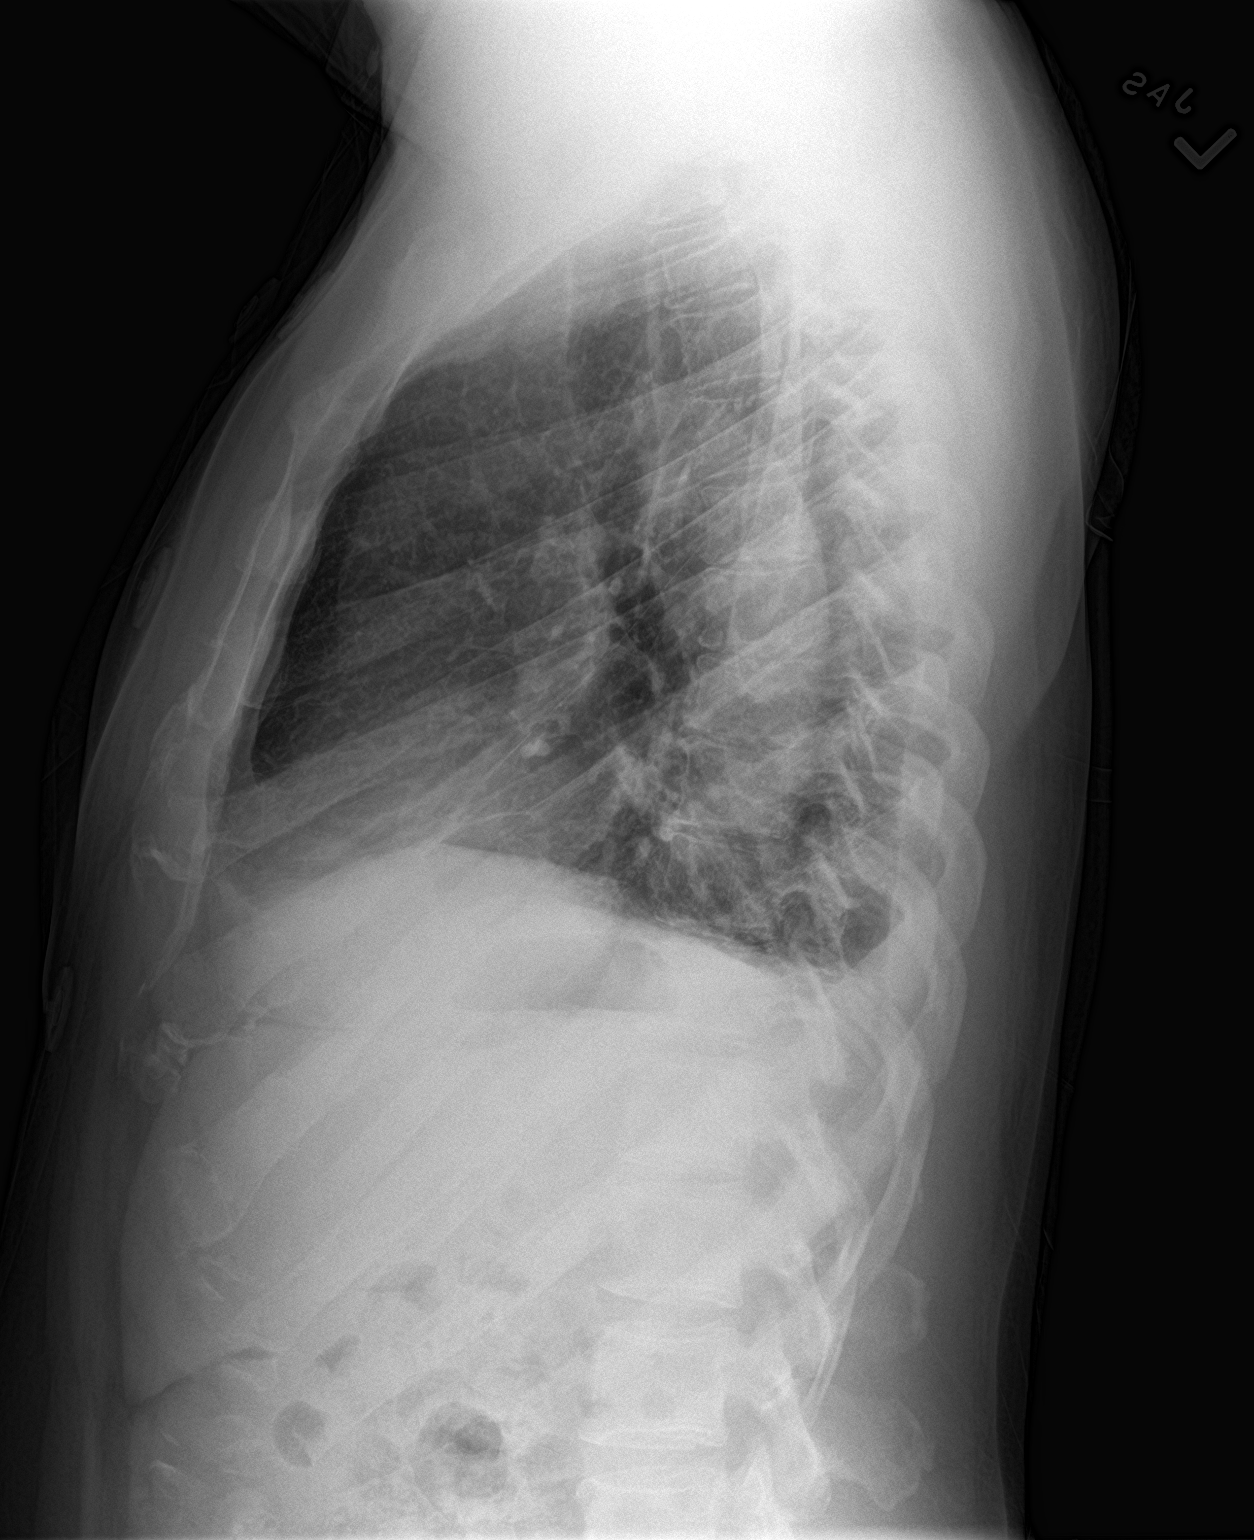

[2 of 2 positions shown; findings below may reference images not displayed]

FINDINGS: Lungs are clear. The heart size and pulmonary vascularity are
normal. No adenopathy. No pneumothorax. No bone lesions.
IMPRESSION: No edema or consolidation.

## 2019-05-22 IMAGING — CT CT ANGIO CHEST
2 of 8 series · 18 of 46 positions shown · IV contrast (iopamidol)
Comparison: Chest radiograph November 12, 2017

CLINICAL DATA: Chest pain with positive D-dimer study

EXAM:
CT ANGIOGRAPHY CHEST WITH CONTRAST
TECHNIQUE: Multidetector CT imaging of the chest was performed using the
standard protocol during bolus administration of intravenous
contrast. Multiplanar CT image reconstructions and MIPs were
obtained to evaluate the vascular anatomy.
CONTRAST:  65 mL AN9LOI-61R IOPAMIDOL (AN9LOI-61R) INJECTION 76%

[Series 6: thins · axial · 0.74mm/px · z∈[-200,+28]mm · 15 of 252 slices shown]
[im 12/252  lung]
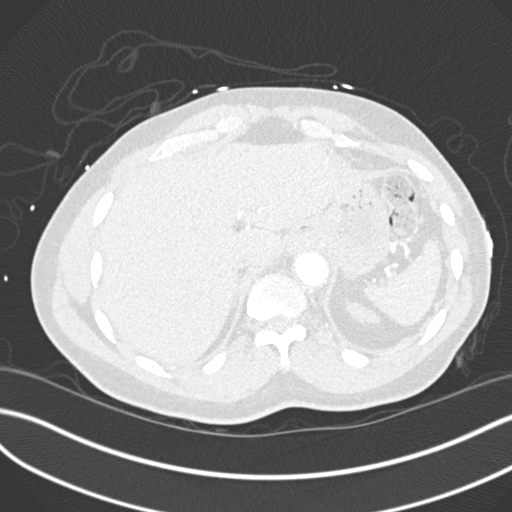
[im 35/252  soft-tissue]
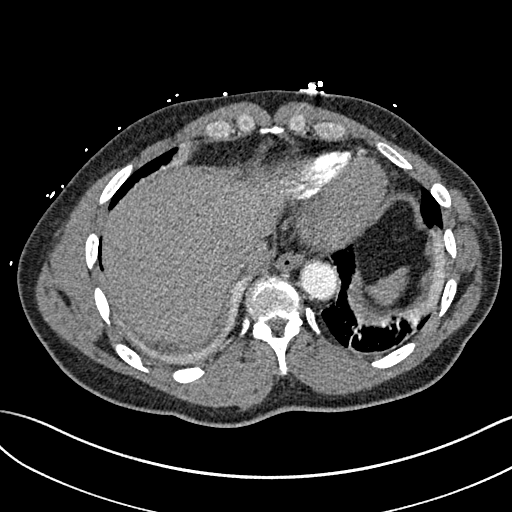
[im 46/252  lung]
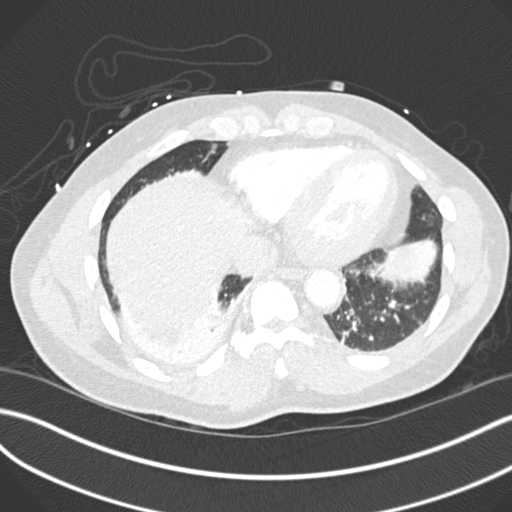
[im 58/252  soft-tissue]
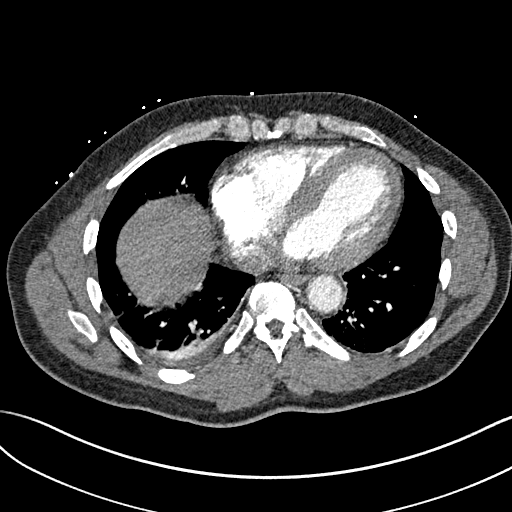
[im 80/252  lung]
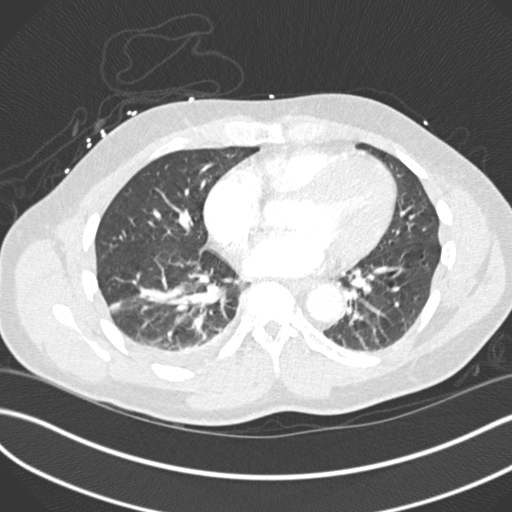
[im 92/252  soft-tissue]
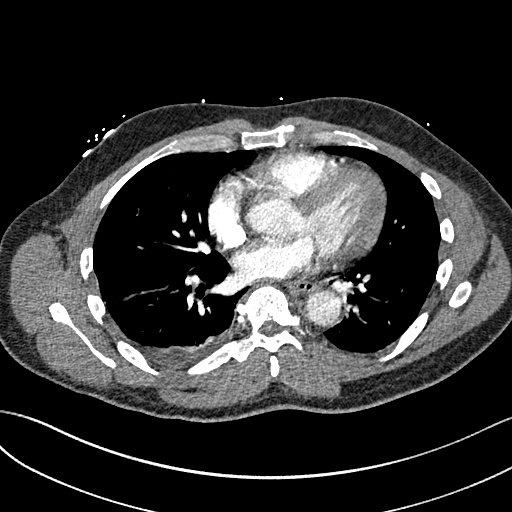
[im 115/252  lung]
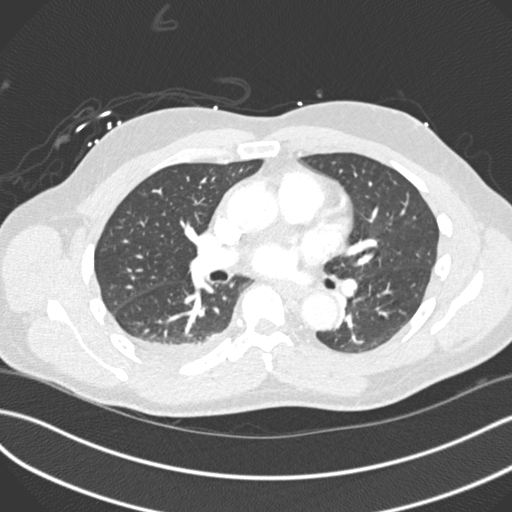
[im 126/252  soft-tissue]
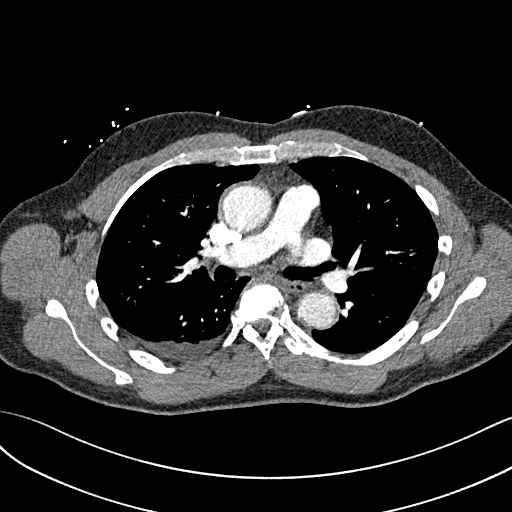
[im 137/252  lung]
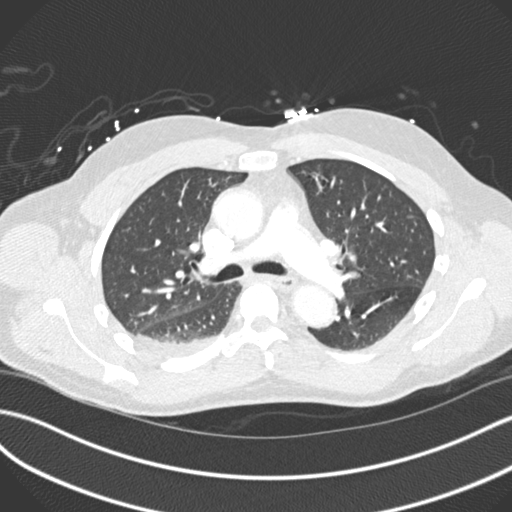
[im 160/252  soft-tissue]
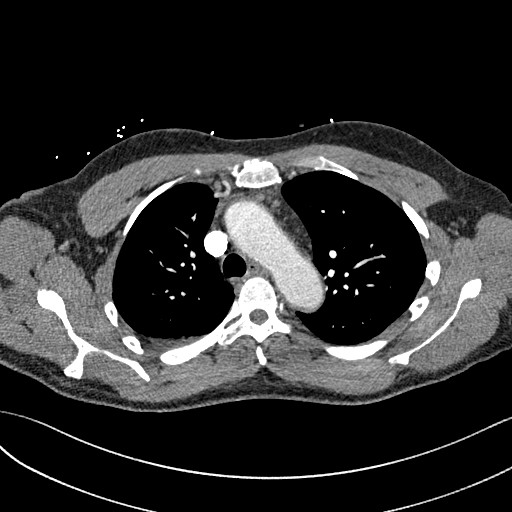
[im 172/252  lung]
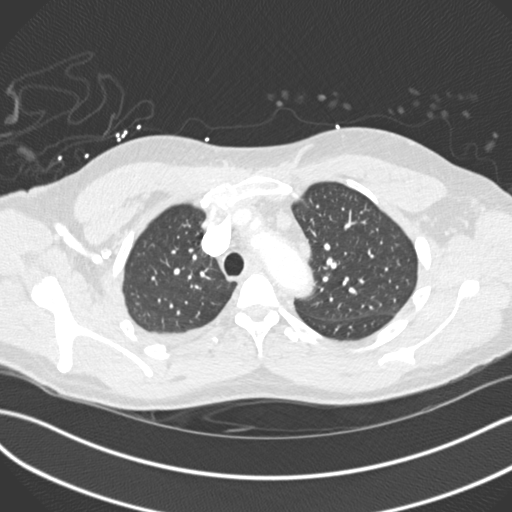
[im 194/252  soft-tissue]
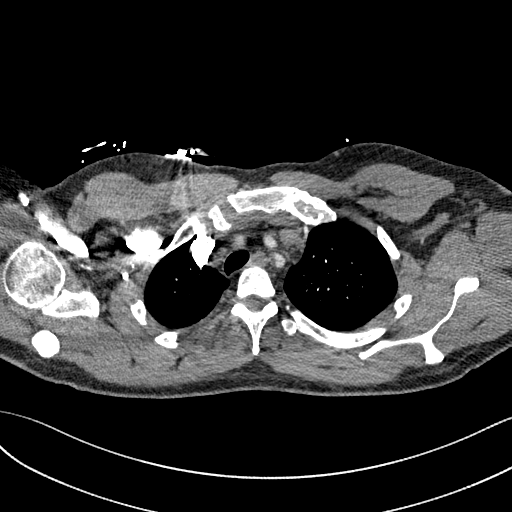
[im 206/252  lung]
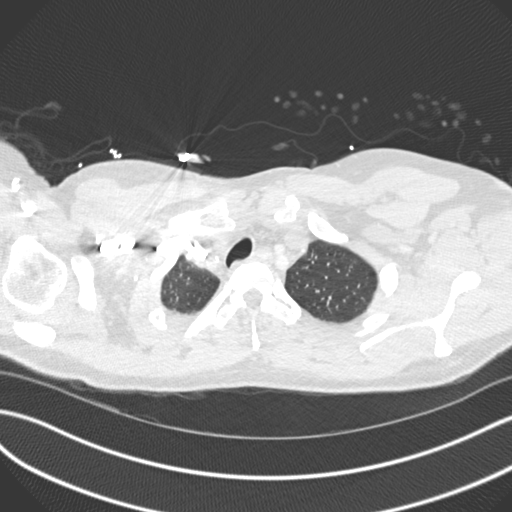
[im 217/252  soft-tissue]
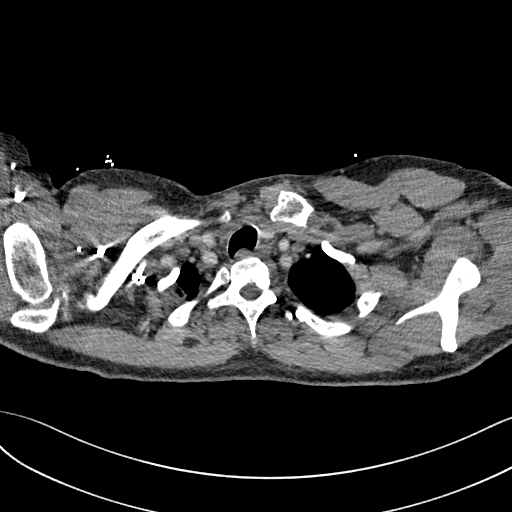
[im 240/252  lung]
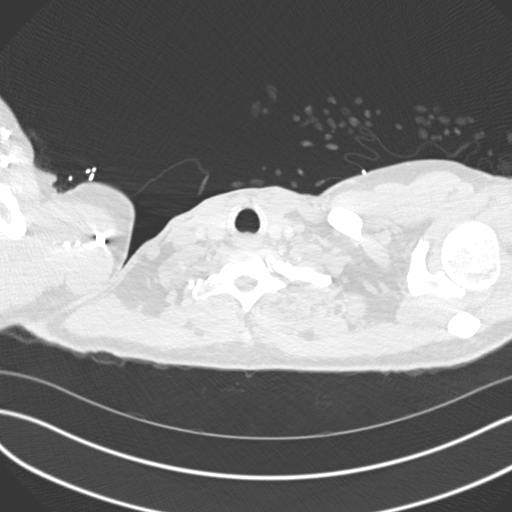

[Series 8: coronal mpr · coronal · 0.50mm/px · 3 of 130 slices shown]
[im 33/130  soft-tissue]
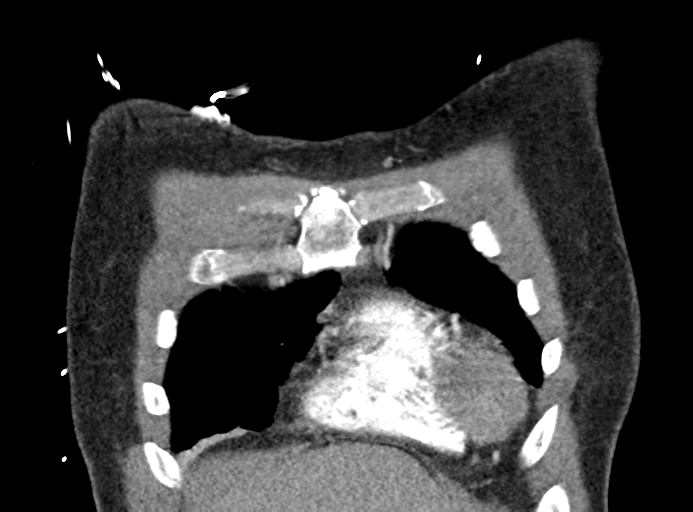
[im 65/130  soft-tissue]
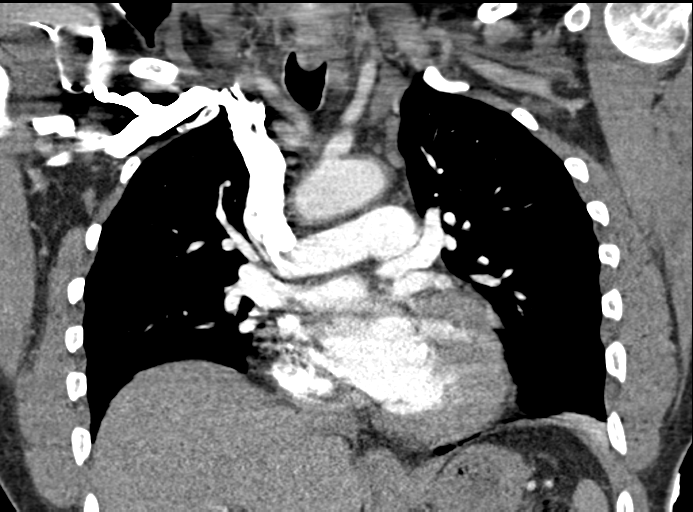
[im 97/130  soft-tissue]
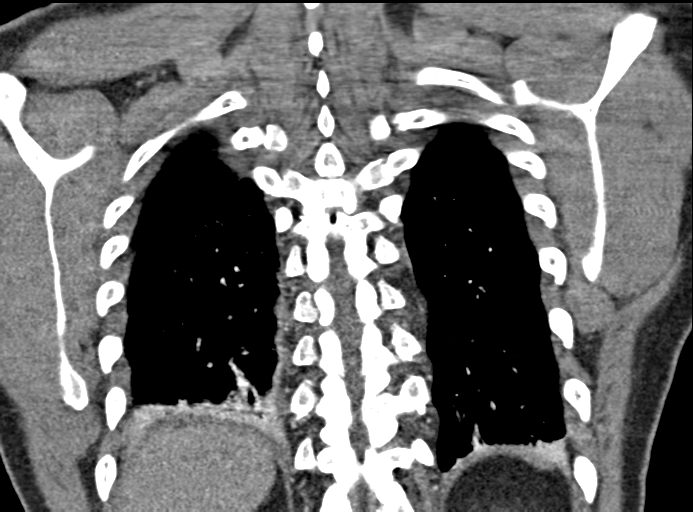

[18 of 46 positions shown; findings below may reference images not displayed]

FINDINGS: Cardiovascular: There is no demonstrable pulmonary embolus. There is
no thoracic aortic aneurysm or dissection. The visualized great
vessels appear unremarkable. There are foci of aortic
atherosclerosis. There is no pericardial effusion or pericardial
thickening evident.

Mediastinum/Nodes: Visualized thyroid appears unremarkable. There
are scattered subcentimeter mediastinal lymph nodes. There is no
adenopathy by size criteria in the thoracic region. No esophageal
lesions are evident.

Lungs/Pleura: There is atelectatic change in the lung bases
posteriorly. There is mild consolidation in these areas, slightly
more on the right than on the left. There is a minimal right pleural
effusion. Lungs elsewhere are clear.

Upper Abdomen: Visualized upper abdominal structures appear normal.

Musculoskeletal: There are no blastic or lytic bone lesions. There
are no evident chest wall lesions.

Review of the MIP images confirms the above findings.
IMPRESSION: 1. No demonstrable pulmonary embolus. No thoracic aortic aneurysm.
There is aortic atherosclerosis.

2. Bibasilar atelectasis. Focal consolidation in the posterior lung
bases is likely due to atelectasis, although there may be a degree
of superimposed pneumonia, particularly in the right base. The lungs
elsewhere are clear. Minimal right pleural effusion.

3. Scattered subcentimeter mediastinal lymph nodes without frank
adenopathy by size criteria.

Aortic Atherosclerosis ([id number]).
